# Patient Record
Sex: Male | Born: 1970 | Race: White | Hispanic: No | Marital: Married | Smoking: Former smoker
Health system: Southern US, Community
[De-identification: ages and names within clinical notes are randomized; demographics above are authoritative.]

## PROBLEM LIST (undated history)

## (undated) DIAGNOSIS — F419 Anxiety disorder, unspecified: Secondary | ICD-10-CM

## (undated) HISTORY — DX: Anxiety disorder, unspecified: F41.9

---

## 2001-07-30 ENCOUNTER — Emergency Department (HOSPITAL_COMMUNITY): Admission: EM | Admit: 2001-07-30 | Discharge: 2001-07-30 | Payer: Self-pay | Admitting: *Deleted

## 2001-07-30 ENCOUNTER — Encounter: Payer: Self-pay | Admitting: *Deleted

## 2005-04-16 ENCOUNTER — Emergency Department (HOSPITAL_COMMUNITY): Admission: EM | Admit: 2005-04-16 | Discharge: 2005-04-16 | Payer: Self-pay | Admitting: Emergency Medicine

## 2015-04-15 ENCOUNTER — Other Ambulatory Visit (HOSPITAL_COMMUNITY): Payer: Self-pay | Admitting: Physician Assistant

## 2015-12-10 ENCOUNTER — Ambulatory Visit: Payer: Self-pay | Admitting: Physician Assistant

## 2015-12-10 ENCOUNTER — Encounter: Payer: Self-pay | Admitting: Physician Assistant

## 2015-12-10 VITALS — BP 129/70 | HR 75 | Temp 98.3°F

## 2015-12-10 DIAGNOSIS — L259 Unspecified contact dermatitis, unspecified cause: Secondary | ICD-10-CM

## 2015-12-10 MED ORDER — DEXAMETHASONE SODIUM PHOSPHATE 10 MG/ML IJ SOLN
10.0000 mg | Freq: Once | INTRAMUSCULAR | Status: AC
  Administered 2015-12-10: 10 mg via INTRAMUSCULAR

## 2015-12-10 MED ORDER — PREDNISONE 10 MG (21) PO TBPK: 10.0000 mg | ORAL_TABLET | Freq: Every day | ORAL | Status: DC

## 2015-12-10 NOTE — Progress Notes (Signed)
S: c/o itchy rash on neck and legs, was outside in yard and then broke out, sx for few days, tried multiple otc meds without relief, denies fever/chills  O: vitals wnl, nad, lungs c t a, cv rrr, skin with small raised red areas some with streaks/blisters, no drainage, n/v intact  A: acute contact dermatitis  P: decadron 10mg  im, sterapred ds if needed

## 2015-12-17 ENCOUNTER — Ambulatory Visit: Payer: Self-pay | Admitting: Physician Assistant

## 2015-12-17 VITALS — BP 120/70 | HR 82 | Temp 98.0°F

## 2015-12-17 DIAGNOSIS — J209 Acute bronchitis, unspecified: Secondary | ICD-10-CM

## 2015-12-17 DIAGNOSIS — R21 Rash and other nonspecific skin eruption: Secondary | ICD-10-CM

## 2015-12-17 MED ORDER — BENZONATATE 200 MG PO CAPS: 200.0000 mg | ORAL_CAPSULE | Freq: Three times a day (TID) | ORAL | Status: DC | PRN

## 2015-12-17 MED ORDER — AZITHROMYCIN 250 MG PO TABS: ORAL_TABLET | ORAL | Status: DC

## 2015-12-17 MED ORDER — ALBUTEROL SULFATE HFA 108 (90 BASE) MCG/ACT IN AERS: 2.0000 | INHALATION_SPRAY | Freq: Four times a day (QID) | RESPIRATORY_TRACT | Status: DC | PRN

## 2015-12-17 NOTE — Progress Notes (Signed)
S: Continues to have rash.  States that rash never got any better with decadron injection and 60mg  taper pack.  Today itching "like crazy". Initally thought that this was poison ivy but area on neck looks different.   Also has sinus pain, cough, congestion with clear nasal drainage and green prod cough. No fever known.  Hx of smoking 15 years ago. O: Macular, red area left side of neck with scaling, right post. Knee same.  Diffuse red papules, no vesicles seen.  Throat min. Drainage, Lungs bilat with expir wheeze cleared with cough, course sound.  Heart RRR A: Dermatitis  Etiology unknown   Not responding to steriods     Acute bronchitis P: Referral to Dermatology     ProAir, tessalon and z-pak

## 2016-07-27 ENCOUNTER — Ambulatory Visit: Payer: Self-pay | Admitting: Physician Assistant

## 2016-07-27 VITALS — BP 110/70 | HR 74 | Temp 98.1°F

## 2016-07-27 DIAGNOSIS — J069 Acute upper respiratory infection, unspecified: Secondary | ICD-10-CM

## 2016-07-27 MED ORDER — CEFDINIR 300 MG PO CAPS: 300.0000 mg | ORAL_CAPSULE | Freq: Two times a day (BID) | ORAL | 0 refills | Status: DC

## 2016-07-27 MED ORDER — HYDROCOD POLST-CPM POLST ER 10-8 MG/5ML PO SUER: 5.0000 mL | Freq: Two times a day (BID) | ORAL | 0 refills | Status: DC | PRN

## 2016-07-27 MED ORDER — OSELTAMIVIR PHOSPHATE 75 MG PO CAPS: 75.0000 mg | ORAL_CAPSULE | Freq: Every day | ORAL | 0 refills | Status: DC

## 2016-07-27 NOTE — Progress Notes (Signed)
S: C/o cough and congestion for 2 days, + fever 2 days ago, night sweats last night, chills, denies cp/sob, v/d; mucus was green this am but clear throughout the day, cough is sporadic, his daughter had the flu last week  Using otc meds:   O: PE: vitals wnl, nad, skin is clammy, perrl eomi, normocephalic, tms dull, nasal mucosa red and swollen, throat injected, neck supple no lymph, lungs c t a, cv rrr, neuro intact, flu swab neg  A:  Acute uri   P: drink fluids, continue regular meds , use otc meds of choice, return if not improving in 5 days, return earlier if worsening  Omnicef, tamiflu preventive 75mg  qd, tussionex 150 ml nr

## 2016-11-22 ENCOUNTER — Ambulatory Visit: Payer: Self-pay | Admitting: Physician Assistant

## 2016-11-22 ENCOUNTER — Encounter: Payer: Self-pay | Admitting: Physician Assistant

## 2016-11-22 VITALS — BP 110/70 | HR 71 | Temp 98.5°F | Resp 16 | Ht 73.0 in | Wt 272.0 lb

## 2016-11-22 DIAGNOSIS — Z0000 Encounter for general adult medical examination without abnormal findings: Secondary | ICD-10-CM

## 2016-11-22 NOTE — Progress Notes (Signed)
S: pt here for wellness physical had biometrics for insurance purposes done at work, no complaints ros neg. 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

## 2017-06-01 DIAGNOSIS — L719 Rosacea, unspecified: Secondary | ICD-10-CM | POA: Insufficient documentation

## 2017-06-01 DIAGNOSIS — F419 Anxiety disorder, unspecified: Secondary | ICD-10-CM | POA: Insufficient documentation

## 2017-06-01 DIAGNOSIS — F410 Panic disorder [episodic paroxysmal anxiety] without agoraphobia: Secondary | ICD-10-CM | POA: Insufficient documentation

## 2017-08-04 DIAGNOSIS — E291 Testicular hypofunction: Secondary | ICD-10-CM | POA: Insufficient documentation

## 2017-11-03 ENCOUNTER — Ambulatory Visit: Payer: Self-pay | Admitting: Family Medicine

## 2017-11-03 VITALS — BP 140/80 | HR 68 | Resp 16 | Ht 73.0 in | Wt 270.0 lb

## 2017-11-03 DIAGNOSIS — Z0189 Encounter for other specified special examinations: Principal | ICD-10-CM

## 2017-11-03 DIAGNOSIS — Z008 Encounter for other general examination: Secondary | ICD-10-CM

## 2017-11-03 NOTE — Progress Notes (Signed)
Subjective: Annual biometrics screening  Patient presents for his annual biometric screening. Patient reports eating a healthy, well-rounded diet but denies getting regular exercise.  Patient regularly sees his primary care provider. PCP: in [place].  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 Patient denies any other issues or concerns.   Review of Systems Unremarkable  Objective  Physical Exam General: Awake, alert and oriented. No acute distress. Well developed, hydrated and nourished. Appears stated age.  HEENT: Supple neck without adenopathy. Sclera is non-icteric. The ear canal is clear without discharge. The tympanic membrane is normal in appearance with normal landmarks and cone of light. Nasal mucosa is pink and moist. Oral mucosa is pink and moist. The pharynx is normal in appearance without tonsillar swelling or exudates.  Skin: Skin in warm, dry and intact without rashes or lesions. Appropriate color for ethnicity. Cardiac: Heart rate and rhythm are normal. No murmurs, gallops, or rubs are auscultated.  Respiratory: The chest wall is symmetric and without deformity. No signs of respiratory distress. Lung sounds are clear in all lobes bilaterally without rales, ronchi, or wheezes.  Neurological: The patient is awake, alert and oriented to person, place, and time with normal speech.  Memory is normal and thought processes intact. No gait abnormalities are appreciated.  Psychiatric: Appropriate mood and affect.   Assessment Annual biometrics screening  Plan  Lipid panel and fasting blood sugar pending. Encouraged routine visits with primary care provider.  Patient's blood pressure is 140/80 today.  Patient reports this is due to anxiety related to coming to the clinic.  Discussed normal values.  Advised patient to monitor this regularly and report abnormal values to his primary care provider. Encouraged patient to get regular exercise and eat a healthy, well-rounded diet.

## 2017-11-04 LAB — LIPID PANEL
Chol/HDL Ratio: 6.1 ratio — ABNORMAL HIGH (ref 0.0–5.0)
Cholesterol, Total: 152 mg/dL (ref 100–199)
HDL: 25 mg/dL — ABNORMAL LOW (ref >39)
LDL Calculated: 111 mg/dL — ABNORMAL HIGH (ref 0–99)
Triglycerides: 82 mg/dL (ref 0–149)
VLDL Cholesterol Cal: 16 mg/dL (ref 5–40)

## 2017-11-04 LAB — GLUCOSE, RANDOM: Glucose: 89 mg/dL (ref 65–99)

## 2017-11-06 NOTE — Progress Notes (Signed)
Jorge HerterShannon, Will you call the patient and inform them that their lipid panel and fasting blood sugar came back?  Everything is normal, with the exception of his HDL cholesterol, LDL cholesterol, and cholesterol/HDL ratio. The HDL cholesterol ("good cholesterol") is decreased at 25, normal values are above 39.  The LDL cholesterol ("bad cholesterol") is elevated at 111, normal values are below 99.  The cholesterol/HDL ratio is elevated at 6.1, normal values are between 0 and 4.4 for women or 0 and 5 from men.  These abnormal values increase their risk for cardiovascular disease.  Please advise the patient to follow-up with their primary care provider regarding these results.

## 2018-02-09 ENCOUNTER — Ambulatory Visit
Admission: RE | Admit: 2018-02-09 | Discharge: 2018-02-09 | Disposition: A | Discharge status: Home / Self Care | Payer: Managed Care, Other (non HMO) | Source: Ambulatory Visit | Attending: Family Medicine | Admitting: Family Medicine

## 2018-02-09 ENCOUNTER — Ambulatory Visit: Payer: Self-pay | Admitting: Family Medicine

## 2018-02-09 VITALS — BP 138/86 | HR 68 | Temp 98.4°F | Resp 16

## 2018-02-09 DIAGNOSIS — R350 Frequency of micturition: Secondary | ICD-10-CM

## 2018-02-09 DIAGNOSIS — K5900 Constipation, unspecified: Secondary | ICD-10-CM | POA: Diagnosis not present

## 2018-02-09 DIAGNOSIS — R109 Unspecified abdominal pain: Secondary | ICD-10-CM | POA: Diagnosis not present

## 2018-02-09 DIAGNOSIS — R319 Hematuria, unspecified: Secondary | ICD-10-CM | POA: Insufficient documentation

## 2018-02-09 LAB — POCT URINALYSIS DIPSTICK
BILIRUBIN UA: NEGATIVE
GLUCOSE UA: NEGATIVE
KETONES UA: NEGATIVE
Leukocytes, UA: NEGATIVE
NITRITE UA: NEGATIVE
Protein, UA: NEGATIVE
SPEC GRAV UA: 1.025 (ref 1.010–1.025)
Urobilinogen, UA: 0.2 E.U./dL
pH, UA: 5 (ref 5.0–8.0)

## 2018-02-09 NOTE — Progress Notes (Signed)
Subjective: back pain     Jorge Medina is a 47 y.o. male who presents for evaluation of left low back pain that began insidiously 2 days ago. The patient has had no prior back problems. Symptoms have been present for 2 days and have gradually worsened.  Onset was related to / precipitated by no known injury. The pain is located in the left lumbar area/flank and radiates to the left groin/scrotum. The pain is described as sharp and occurs intermittently.  Rates his pain at 8/10 and reports that he does not usually go to the doctor but that pain has been severe.  Symptoms are exacerbated by standing up straight . Factors which relieve the pain include change in body position with some relief. Symptoms are improved by sitting or laying down and briefly after urination. He has also tried NSAIDs which provided no symptom relief. He has constipation associated with the back pain.  Patient unclear if this is a normal variation for him.  Small BM yesterday but has not had one today, doesn't think he had one for a few days before his small BM yesterday.  Previous history of symptoms: never.  Patient also reports urinary frequency.  Denies any other urinary symptoms.  Denies any history of nephrolithiasis.  Denies fever, chills, malaise, fatigue, nausea, vomiting, myalgias, or arthralgias.  Denies testicular erythema or edema.  Denies any other relevant history.  Denies urinary retention or saddle anesthesia.  Denies numbness, tingling, weakness, spasticity, or any neurologic symptoms.  Denies any other symptoms or complaints.   Review of Systems Pertinent items noted in HPI and remainder of comprehensive ROS otherwise negative.    Objective:  General appearance: alert, cooperative, appears stated age and moderate distress with position change only. No distress otherwise.  Abdomen:  Normal findings: bowel sounds normal, no masses palpable, no organomegaly, soft, and symmetric.  No guarding or rebound tenderness.   Negative Rovsing sign. Abnormal findings:  moderate tenderness in the lower abdomen bilaterally.  Extremities: extremities normal, atraumatic, no cyanosis or edema Pulses: 2+ and symmetric Skin: Skin color, texture, turgor normal. No rashes or lesions  Back: Full range of motion, mild generalized tenderness to left lumbar and thoracic regions.  No midline tenderness or deformity.  No spasm, normal curvature.  Symmetric.  Normal gait. Patient endorses immediate pain in his left lower back upon lifting both his left and subsequently his right leg off of the table during straight leg raise testing.  Patient appears to be in significant pain when changing positions and it takes him a long time to do so.  Neurological: normal DTRs, muscle strength and reflexes.   Diagnostic Results: Urine dipstick negative for all components except 1+ blood.   EXAM: ABDOMEN - 1 VIEW  COMPARISON: None.  FINDINGS: Bowel gas pattern is nonobstructive. Moderate amount of stool within the colon. No evidence of soft tissue mass or abnormal fluid collection. No evidence of free intraperitoneal air. No evidence of renal or ureteral calculi. Osseous structures are unremarkable. Lung bases appear clear.  IMPRESSION: Negative.   Electronically Signed By: Bary Richard M.D. On: 02/09/2018 14:30  Assessment:   Left low back pain Urinary frequency Constipation Hematuria   Plan:   Patient declined Toradol shot.  Discussed x-ray findings with patient. Referred patient to the emergency department for further evaluation and treatment.  We are unable to obtain a CT or MRI through our clinic.  Consulted Dr. Sullivan Lone (collaborating physician) regarding the care and treatment plan of this patient.

## 2018-03-13 ENCOUNTER — Encounter: Payer: Self-pay | Admitting: Emergency Medicine

## 2018-03-13 ENCOUNTER — Ambulatory Visit: Payer: Self-pay | Admitting: Emergency Medicine

## 2018-03-13 VITALS — BP 140/78 | HR 83 | Temp 98.3°F | Resp 14 | Ht 73.0 in | Wt 283.0 lb

## 2018-03-13 DIAGNOSIS — J0101 Acute recurrent maxillary sinusitis: Secondary | ICD-10-CM

## 2018-03-13 DIAGNOSIS — J209 Acute bronchitis, unspecified: Secondary | ICD-10-CM

## 2018-03-13 MED ORDER — PREDNISONE 50 MG PO TABS: ORAL_TABLET | ORAL | 0 refills | Status: DC

## 2018-03-13 MED ORDER — CEFDINIR 300 MG PO CAPS: 300.0000 mg | ORAL_CAPSULE | Freq: Two times a day (BID) | ORAL | 0 refills | Status: DC

## 2018-03-13 NOTE — Progress Notes (Signed)
Subjective:     Patient ID: Jorge Medina, male   DOB: 1970-09-20, 47 y.o.   MRN: [id number]  HPI URI HISTORY  [name] is a 47 y.o. male who complains of onset of cough and cold symptoms and sinus symptoms for 5 days, progressively worsening.   have been using over-the-counter treatment which helps a little bit.  In the past, he has had sinus infections and bronchitis, and has an albuterol inhaler at home if needed for wheezing but he denies prior diagnosis of asthma. Former smoker.  No chills/sweats + Low-grade fever  +  Nasal congestion +  Discolored green post-nasal drainage, blowing out green nasal mucus Mild sinus pain/pressure No sore throat  +  Cough, productive of greenish sputum Mild wheezing Positive, chest congestion No hemoptysis No shortness of breath No pleuritic pain  No itchy/red eyes No earache  No nausea No vomiting No abdominal pain No diarrhea  No skin rashes +  Fatigue No myalgias No headache    Review of Systems  All other systems reviewed and are negative.      Objective:   Physical Exam  Constitutional: He is oriented to person, place, and time. He appears well-developed and well-nourished. No distress.  HENT:  Head: Normocephalic and atraumatic.  Right Ear: Tympanic membrane, external ear and ear canal normal.  Left Ear: Tympanic membrane, external ear and ear canal normal.  Nose: Mucosal edema and rhinorrhea present. Right sinus exhibits maxillary sinus tenderness. Left sinus exhibits maxillary sinus tenderness.  Mouth/Throat: Oropharynx is clear and moist. No oral lesions. No oropharyngeal exudate.  Eyes: Right eye exhibits no discharge. Left eye exhibits no discharge. No scleral icterus.  Neck: Neck supple.  Cardiovascular: Normal rate, regular rhythm and normal heart sounds.  Pulmonary/Chest: Effort normal. He has wheezes (Mild, late expiratory bilaterally.  Good air movement bilaterally). He has rhonchi. He has no rales.   Lymphadenopathy:    He has no cervical adenopathy.  Neurological: He is alert and oriented to person, place, and time.  Skin: Skin is warm and dry.  Nursing note and vitals reviewed.      Assessment:     Acute maxillary sinusitis, with bronchitis with mild bronchospasm.  In my opinion, he likely improve with antibiotic as there is likely a bacterial component.  Also, inflammatory component    Plan:      Treatment options discussed, as well as risks, benefits, alternatives. Patient voiced understanding and agreement with the following plans:  An After Visit Summary was printed and given to the patient. You have a sinus infection with mild bronchitis with mild wheezing. 2 prescriptions have been sent to your pharmacy: Cefdinir twice a day for 10 days-this is an antibiotic. Prednisone, once a day with food 5 days.  This should really help with wheezing and cough inflammation. Okay to take OTC decongestant or antihistamine or cough med if needed. If needed, you can use the albuterol inhaler that you have at home, 2 puffs every 6 hours if needed for wheezing. Follow-up with your PCP if not improved in 5 to 7 days, sooner if worse.(Emergency room if any severe symptoms) New Prescriptions   CEFDINIR (OMNICEF) 300 MG CAPSULE    Take 1 capsule (300 mg total) by mouth 2 (two) times daily. X 10 days   PREDNISONE (DELTASONE) 50 MG TABLET    Take 1 daily with a meal for 5 days   Precautions discussed. Red flags discussed. Questions invited and answered. Patient voiced understanding and agreement.

## 2018-03-13 NOTE — Patient Instructions (Addendum)
You have a sinus infection with mild bronchitis with mild wheezing. 2 prescriptions have been sent to your pharmacy: Cefdinir twice a day for 10 days-this is an antibiotic. Prednisone, once a day with food 5 days.  This should really help with wheezing and cough inflammation. Okay to take OTC decongestant or antihistamine or cough med if needed. If needed, you can use the albuterol inhaler that you have at home, 2 puffs every 6 hours if needed for wheezing. Follow-up with your PCP if not improved in 5 to 7 days, sooner if worse.(Emergency room if any severe symptoms) Acute Bronchitis, Adult Acute bronchitis is when air tubes (bronchi) in the lungs suddenly get swollen. The condition can make it hard to breathe. It can also cause these symptoms:  A cough.  Coughing up clear, yellow, or green mucus.  Wheezing.  Chest congestion.  Shortness of breath.  A fever.  Body aches.  Chills.  A sore throat.  Follow these instructions at home: Medicines  Take over-the-counter and prescription medicines only as told by your doctor.  If you were prescribed an antibiotic medicine, take it as told by your doctor. Do not stop taking the antibiotic even if you start to feel better. General instructions  Rest.  Drink enough fluids to keep your pee (urine) clear or pale yellow.  Avoid smoking and secondhand smoke. If you smoke and you need help quitting, ask your doctor. Quitting will help your lungs heal faster.  Use an inhaler, cool mist vaporizer, or humidifier as told by your doctor.  Keep all follow-up visits as told by your doctor. This is important. How is this prevented? To lower your risk of getting this condition again:  Wash your hands often with soap and water. If you cannot use soap and water, use hand sanitizer.  Avoid contact with people who have cold symptoms.  Try not to touch your hands to your mouth, nose, or eyes.  Make sure to get the flu shot every  year.  Contact a doctor if:  Your symptoms do not get better in 2 weeks. Get help right away if:  You cough up blood.  You have chest pain.  You have very bad shortness of breath.  You become dehydrated.  You faint (pass out) or keep feeling like you are going to pass out.  You keep throwing up (vomiting).  You have a very bad headache.  Your fever or chills gets worse. This information is not intended to replace advice given to you by your health care provider. Make sure you discuss any questions you have with your health care provider. Document Released: 11/09/2007 Document Revised: 12/30/2015 Document Reviewed: 11/11/2015 Elsevier Interactive Patient Education  2018 [employer].  Sinusitis, Adult Sinusitis is soreness and inflammation of your sinuses. Sinuses are hollow spaces in the bones around your face. They are located:  Around your eyes.  In the middle of your forehead.  Behind your nose.  In your cheekbones.  Your sinuses and nasal passages are lined with a stringy fluid (mucus). Mucus normally drains out of your sinuses. When your nasal tissues get inflamed or swollen, the mucus can get trapped or blocked so air cannot flow through your sinuses. This lets bacteria, viruses, and funguses grow, and that leads to infection. Follow these instructions at home: Medicines  Take, use, or apply over-the-counter and prescription medicines only as told by your doctor. These may include nasal sprays.  If you were prescribed an antibiotic medicine, take it as  told by your doctor. Do not stop taking the antibiotic even if you start to feel better. Hydrate and Humidify  Drink enough water to keep your pee (urine) clear or pale yellow.  Use a cool mist humidifier to keep the humidity level in your home above 50%.  Breathe in steam for 10-15 minutes, 3-4 times a day or as told by your doctor. You can do this in the bathroom while a hot shower is running.  Try not to  spend time in cool or dry air. Rest  Rest as much as possible.  Sleep with your head raised (elevated).  Make sure to get enough sleep each night. General instructions  Put a warm, moist washcloth on your face 3-4 times a day or as told by your doctor. This will help with discomfort.  Wash your hands often with soap and water. If there is no soap and water, use hand sanitizer.  Do not smoke. Avoid being around people who are smoking (secondhand smoke).  Keep all follow-up visits as told by your doctor. This is important. Contact a doctor if:  You have a fever.  Your symptoms get worse.  Your symptoms do not get better within 10 days. Get help right away if:  You have a very bad headache.  You cannot stop throwing up (vomiting).  You have pain or swelling around your face or eyes.  You have trouble seeing.  You feel confused.  Your neck is stiff.  You have trouble breathing.

## 2018-11-15 ENCOUNTER — Other Ambulatory Visit: Payer: Self-pay

## 2018-11-15 ENCOUNTER — Encounter: Payer: Self-pay | Admitting: Adult Health

## 2018-11-15 ENCOUNTER — Telehealth: Payer: Managed Care, Other (non HMO) | Admitting: Adult Health

## 2018-11-15 DIAGNOSIS — R079 Chest pain, unspecified: Secondary | ICD-10-CM

## 2018-11-15 DIAGNOSIS — Z789 Other specified health status: Secondary | ICD-10-CM

## 2018-11-15 DIAGNOSIS — R110 Nausea: Secondary | ICD-10-CM

## 2018-11-15 NOTE — Addendum Note (Signed)
Addended by: Doreen Beam on: 11/15/2018 01:01 PM   Modules accepted: Orders

## 2018-11-15 NOTE — Patient Instructions (Addendum)
Abdominal Pain, Adult  Many things can cause belly (abdominal) pain. Most times, belly pain is not dangerous. Many cases of belly pain can be watched and treated at home. Sometimes belly pain is serious, though. Your doctor will try to find the cause of your belly pain. Follow these instructions at home:  Take over-the-counter and prescription medicines only as told by your doctor. Do not take medicines that help you poop (laxatives) unless told to by your doctor.  Drink enough fluid to keep your pee (urine) clear or pale yellow.  Watch your belly pain for any changes.  Keep all follow-up visits as told by your doctor. This is important. Contact a doctor if:  Your belly pain changes or gets worse.  You are not hungry, or you lose weight without trying.  You are having trouble pooping (constipated) or have watery poop (diarrhea) for more than 2-3 days.  You have pain when you pee or poop.  Your belly pain wakes you up at night.  Your pain gets worse with meals, after eating, or with certain foods.  You are throwing up and cannot keep anything down.  You have a fever. Get help right away if:  Your pain does not go away as soon as your doctor says it should.  You cannot stop throwing up.  Your pain is only in areas of your belly, such as the right side or the left lower part of the belly.  You have bloody or black poop, or poop that looks like tar.  You have very bad pain, cramping, or bloating in your belly.  You have signs of not having enough fluid or water in your body (dehydration), such as: ? Dark pee, very little pee, or no pee. ? Cracked lips. ? Dry mouth. ? Sunken eyes. ? Sleepiness. ? Weakness. This information is not intended to replace advice given to you by your health care provider. Make sure you discuss any questions you have with your health care provider. Document Released: 11/09/2007 Document Revised: 12/11/2015 Document Reviewed: 11/04/2015 Elsevier  Interactive Patient Education  2019 [employer] Nonspecific Chest Pain Chest pain can be caused by many different conditions. Some causes of chest pain can be life-threatening. These will require treatment right away. Serious causes of chest pain include:  Heart attack.  A tear in the body's main blood vessel.  Redness and swelling (inflammation) around your heart.  Blood clot in your lungs. Other causes of chest pain may not be so serious. These include:  Heartburn.  Anxiety or stress.  Damage to bones or muscles in your chest.  Lung infections. Chest pain can feel like:  Pain or discomfort in your chest.  Crushing, pressure, aching, or squeezing pain.  Burning or tingling.  Dull or sharp pain that is worse when you move, cough, or take a deep breath.  Pain or discomfort that is also felt in your back, neck, jaw, shoulder, or arm, or pain that spreads to any of these areas. It is hard to know whether your pain is caused by something that is serious or something that is not so serious. So it is important to see your doctor right away if you have chest pain. Follow these instructions at home: Medicines  Take over-the-counter and prescription medicines only as told by your doctor.  If you were prescribed an antibiotic medicine, take it as told by your doctor. Do not stop taking the antibiotic even if you start to feel better. Lifestyle   Rest  as told by your doctor.  Do not use any products that contain nicotine or tobacco, such as cigarettes, e-cigarettes, and chewing tobacco. If you need help quitting, ask your doctor.  Do not drink alcohol.  Make lifestyle changes as told by your doctor. These may include: ? Getting regular exercise. Ask your doctor what activities are safe for you. ? Eating a heart-healthy diet. A diet and nutrition specialist (dietitian) can help you to learn healthy eating options. ? Staying at a healthy weight. ? Treating diabetes or high  blood pressure, if needed. ? Lowering your stress. Activities such as yoga and relaxation techniques can help. General instructions  Pay attention to any changes in your symptoms. Tell your doctor about them or any new symptoms.  Avoid any activities that cause chest pain.  Keep all follow-up visits as told by your doctor. This is important. You may need more testing if your chest pain does not go away. Contact a doctor if:  Your chest pain does not go away.  You feel depressed.  You have a fever. Get help right away if:  Your chest pain is worse.  You have a cough that gets worse, or you cough up blood.  You have very bad (severe) pain in your belly (abdomen).  You pass out (faint).  You have either of these for no clear reason: ? Sudden chest discomfort. ? Sudden discomfort in your arms, back, neck, or jaw.  You have shortness of breath at any time.  You suddenly start to sweat, or your skin gets clammy.  You feel sick to your stomach (nauseous).  You throw up (vomit).  You suddenly feel lightheaded or dizzy.  You feel very weak or tired.  Your heart starts to beat fast, or it feels like it is skipping beats. These symptoms may be an emergency. Do not wait to see if the symptoms will go away. Get medical help right away. Call your local emergency services (911 in the [place] Do not drive yourself to the hospital. Summary  Chest pain can be caused by many different conditions. The cause may be serious and need treatment right away. If you have chest pain, see your doctor right away.  Follow your doctor's instructions for taking medicines and making lifestyle changes.  Keep all follow-up visits as told by your doctor. This includes visits for any further testing if your chest pain does not go away.  Be sure to know the signs that show that your condition has become worse. Get help right away if you have these symptoms. This information is not intended to replace  advice given to you by your health care provider. Make sure you discuss any questions you have with your health care provider. Document Released: 11/09/2007 Document Revised: 11/23/2017 Document Reviewed: 11/23/2017 Elsevier Interactive Patient Education  2019 [employer] Inc. Nausea, Adult Nausea is feeling sick to your stomach or feeling that you are about to throw up (vomit). Feeling sick to your stomach is usually not serious, but it may be an early sign of a more serious medical problem. As you feel sicker to your stomach, you may throw up. If you throw up, or if you are not able to drink enough fluids, there is a risk that you may lose too much water in your body (get dehydrated). If you lose too much water in your body, you may:  Feel tired.  Feel thirsty.  Have a dry mouth.  Have cracked lips.  Go pee (urinate) less  often. Older adults and people who have other diseases or a weak body defense system (immune system) have a higher risk of losing too much water in the body. The main goals of treating this condition are:  To relieve your nausea.  To ensure your nausea occurs less often.  To prevent throwing up and losing too much fluid. Follow these instructions at home: Watch your symptoms for any changes. Tell your doctor about them. Follow these instructions as told by your doctor. Eating and drinking      Take an ORS (oral rehydration solution). This is a drink that is sold at pharmacies and stores.  Drink clear fluids in small amounts as you are able. These include: ? Water. ? Ice chips. ? Fruit juice that has water added (diluted fruit juice). ? Low-calorie sports drinks.  Eat bland, easy-to-digest foods in small amounts as you are able, such as: ? Bananas. ? Applesauce. ? Rice. ? Low-fat (lean) meats. ? Toast. ? Crackers.  Avoid drinking fluids that have a lot of sugar or caffeine in them. This includes energy drinks, sports drinks, and soda.  Avoid alcohol.   Avoid spicy or fatty foods. General instructions  Take over-the-counter and prescription medicines only as told by your doctor.  Rest at home while you get better.  Drink enough fluid to keep your pee (urine) pale yellow.  Take slow and deep breaths when you feel sick to your stomach.  Avoid food or things that have strong smells.  Wash your hands often with soap and water. If you cannot use soap and water, use hand sanitizer.  Make sure that all people in your home wash their hands well and often.  Keep all follow-up visits as told by your doctor. This is important. Contact a doctor if:  You feel sicker to your stomach.  You feel sick to your stomach for more than 2 days.  You throw up.  You are not able to drink fluids without throwing up.  You have new symptoms.  You have a fever.  You have a headache.  You have muscle cramps.  You have a rash.  You have pain while peeing.  You feel light-headed or dizzy. Get help right away if:  You have pain in your chest, neck, arm, or jaw.  You feel very weak or you pass out (faint).  You have throw up that is bright red or looks like coffee grounds.  You have bloody or black poop (stools) or poop that looks like tar.  You have a very bad headache, a stiff neck, or both.  You have very bad pain, cramping, or bloating in your belly (abdomen).  You have trouble breathing or you are breathing very quickly.  Your heart is beating very quickly.  Your skin feels cold and clammy.  You feel confused.  You have signs of losing too much water in your body, such as: ? Dark pee, very little pee, or no pee. ? Cracked lips. ? Dry mouth. ? Sunken eyes. ? Sleepiness. ? Weakness. These symptoms may be an emergency. Do not wait to see if the symptoms will go away. Get medical help right away. Call your local emergency services (911 in the [place] Do not drive yourself to the hospital. Summary  Nausea is feeling sick to  your stomach or feeling that you are about to throw up (vomit).  If you throw up, or if you are not able to drink enough fluids, there is a risk that  you may lose too much water in your body (get dehydrated).  Eat and drink what your doctor tells you. Take over-the-counter and prescription medicines only as told by your doctor.  Contact a doctor right away if your symptoms get worse or you have new symptoms.  Keep all follow-up visits as told by your doctor. This is important. This information is not intended to replace advice given to you by your health care provider. Make sure you discuss any questions you have with your health care provider. Document Released: 05/12/2011 Document Revised: 10/31/2017 Document Reviewed: 10/31/2017 Elsevier Interactive Patient Education  2019 [employer]

## 2018-11-15 NOTE — Progress Notes (Addendum)
Virtual Visit via Video Note  I connected with Jorge Medina on 11/15/18 at 12:30 PM EDT by a video enabled telemedicine application and verified that I am speaking with the correct person using two identifiers.  Location: Patient: at his home  Provider: in the office at [facility] building    I discussed the limitations of evaluation and management by telemedicine and the availability of in person appointments. The patient expressed understanding and agreed to proceed.  History of Present Illness: Patient is a 48 year old male in no acute distress who calls the clinic for complaint of nausea onset since 11/12/18, decreased energy, fatigue, and states he has " left sided chest pain mostly when he lies down" , he is  also having intermittent  left lower abdominal pain. Chest pain is intermittent, described as dull ache.6/10. He reports he has had mild shortness of breath at times with moving.   Decreased appetite due to pain and nausea.   He denies any known covid exposures.  Last Bowel movement was Wednesday 11/14/18 denies blood, mucous, or tarry dark color.  Urinating normal.   He has mild post nasal drip since mowing the yard over the weekend. He denies any cough.   Patient  denies any fever, body aches,chills, rash,  , nausea, vomiting, or diarrhea. Denies back pain.  Denies any ill exposures.  Denies diaphoresis or radiating pain to neck, jaw or shoulder.  Denies any previous abdominal surgeries or cardiac history.   He does have history listed of panic disorder and anxiety.  Allergies  Allergen Reactions  . Nalbuphine Other (See Comments)      Observations/Objective:   Patient is alert and oriented and responsive to questions Engages in conversation with provider. Speaks in full sentences without any pauses without any shortness of breath or distress.   He appears to have pale skin on video.     Assessment and Plan:  [name] was seen today for nasal congestion, nausea and  cough.  Diagnoses and all orders for this visit:  Chest pain, unspecified type  Nausea without vomiting  Afebrile   Go to the emergency room now do not self drive, for Call [phone] for emergencies.  Follow Up Instructions: Patient advised emergency room now for evaluation to rule out cardiac versus abdominal etiology. Needs stat labs troponin, CKMB and EKG, possible abdominal imaging/chest xray.  Patient verbalized understanding of all instructions given and denies any further questions at this time.   He is advised not to self drive.  May follow up with primary care or this office after emergency evaluation. Wear mask to the emergency room.    I discussed the assessment and treatment plan with the patient. The patient was provided an opportunity to ask questions and all were answered. The patient agreed with the plan and demonstrated an understanding of the instructions.    The patient was advised to call back or seek an in-person evaluation if the symptoms worsen or if the condition fails to improve as anticipated.  I provided 15 minutes of non-face-to-face time during this encounter.   Marcille Buffy, FNP

## 2019-01-24 ENCOUNTER — Ambulatory Visit: Payer: Managed Care, Other (non HMO) | Admitting: Adult Health

## 2019-01-24 ENCOUNTER — Other Ambulatory Visit: Payer: Self-pay

## 2019-01-24 ENCOUNTER — Encounter: Payer: Self-pay | Admitting: Adult Health

## 2019-01-24 VITALS — BP 138/90 | HR 83 | Temp 98.1°F | Resp 18 | Ht 73.0 in | Wt 292.0 lb

## 2019-01-24 DIAGNOSIS — Z008 Encounter for other general examination: Secondary | ICD-10-CM | POA: Diagnosis not present

## 2019-01-24 DIAGNOSIS — Z0189 Encounter for other specified special examinations: Secondary | ICD-10-CM | POA: Diagnosis not present

## 2019-01-24 NOTE — Patient Instructions (Addendum)
I will have the office call you on your glucose and cholesterol results when they return if you have not heard within 1 week please call the office.  This biometric physical is a brief physical and the only labs done are glucose and your lipid panel(cholesterol) and is  not a substitute for seeing a primary care provider for a complete annual physical. Please see a primary care physician for routine health maintenance, labs and full physical at least yearly and follow up as recommended by your provider. Provider also recommends if you do not have a primary care provider for patient to establish care as soon as possible .Patient may chose provider of choice. Also gave the [place]  PHYSICIAN/PROVIDER  REFERRAL LINE at [phone] or web site at [email or web address] to help assist with finding a primary care doctor.  Patient verbalizes understanding that his office is acute care only and not a substitute for a primary care or for the management of chronic conditions.    DASH Eating Plan DASH stands for "Dietary Approaches to Stop Hypertension." The DASH eating plan is a healthy eating plan that has been shown to reduce high blood pressure (hypertension). It may also reduce your risk for type 2 diabetes, heart disease, and stroke. The DASH eating plan may also help with weight loss. What are tips for following this plan?  General guidelines  Avoid eating more than 2,300 mg (milligrams) of salt (sodium) a day. If you have hypertension, you may need to reduce your sodium intake to 1,500 mg a day.  Limit alcohol intake to no more than 1 drink a day for nonpregnant women and 2 drinks a day for men. One drink equals 12 oz of beer, 5 oz of wine, or 1 oz of hard liquor.  Work with your health care provider to maintain a healthy body weight or to lose weight. Ask what an ideal weight is for you.  Get at least 30 minutes of exercise that causes your heart to beat faster (aerobic exercise) most days of  the week. Activities may include walking, swimming, or biking.  Work with your health care provider or diet and nutrition specialist (dietitian) to adjust your eating plan to your individual calorie needs. Reading food labels   Check food labels for the amount of sodium per serving. Choose foods with less than 5 percent of the Daily Value of sodium. Generally, foods with less than 300 mg of sodium per serving fit into this eating plan.  To find whole grains, look for the word "whole" as the first word in the ingredient list. Shopping  Buy products labeled as "low-sodium" or "no salt added."  Buy fresh foods. Avoid canned foods and premade or frozen meals. Cooking  Avoid adding salt when cooking. Use salt-free seasonings or herbs instead of table salt or sea salt. Check with your health care provider or pharmacist before using salt substitutes.  Do not fry foods. Cook foods using healthy methods such as baking, boiling, grilling, and broiling instead.  Cook with heart-healthy oils, such as olive, canola, soybean, or sunflower oil. Meal planning  Eat a balanced diet that includes: ? 5 or more servings of fruits and vegetables each day. At each meal, try to fill half of your plate with fruits and vegetables. ? Up to 6-8 servings of whole grains each day. ? Less than 6 oz of lean meat, poultry, or fish each day. A 3-oz serving of meat is about the same size  as a deck of cards. One egg equals 1 oz. ? 2 servings of low-fat dairy each day. ? A serving of nuts, seeds, or beans 5 times each week. ? Heart-healthy fats. Healthy fats called Omega-3 fatty acids are found in foods such as flaxseeds and coldwater fish, like sardines, salmon, and mackerel.  Limit how much you eat of the following: ? Canned or prepackaged foods. ? Food that is high in trans fat, such as fried foods. ? Food that is high in saturated fat, such as fatty meat. ? Sweets, desserts, sugary drinks, and other foods with  added sugar. ? Full-fat dairy products.  Do not salt foods before eating.  Try to eat at least 2 vegetarian meals each week.  Eat more home-cooked food and less restaurant, buffet, and fast food.  When eating at a restaurant, ask that your food be prepared with less salt or no salt, if possible. What foods are recommended? The items listed may not be a complete list. Talk with your dietitian about what dietary choices are best for you. Grains Whole-grain or whole-wheat bread. Whole-grain or whole-wheat pasta. Brown rice. [place]. Bulgur. Whole-grain and low-sodium cereals. Pita bread. Low-fat, low-sodium crackers. Whole-wheat flour tortillas. Vegetables Fresh or frozen vegetables (raw, steamed, roasted, or grilled). Low-sodium or reduced-sodium tomato and vegetable juice. Low-sodium or reduced-sodium tomato sauce and tomato paste. Low-sodium or reduced-sodium canned vegetables. Fruits All fresh, dried, or frozen fruit. Canned fruit in natural juice (without added sugar). Meat and other protein foods Skinless chicken or [place]. Ground chicken or [place]. Pork with fat trimmed off. Fish and seafood. Egg whites. Dried beans, peas, or lentils. Unsalted nuts, nut butters, and seeds. Unsalted canned beans. Lean cuts of beef with fat trimmed off. Low-sodium, lean deli meat. Dairy Low-fat (1%) or fat-free (skim) milk. Fat-free, low-fat, or reduced-fat cheeses. Nonfat, low-sodium ricotta or cottage cheese. Low-fat or nonfat yogurt. Low-fat, low-sodium cheese. Fats and oils Soft margarine without trans fats. Vegetable oil. Low-fat, reduced-fat, or light mayonnaise and salad dressings (reduced-sodium). Canola, safflower, olive, soybean, and sunflower oils. Avocado. Seasoning and other foods Herbs. Spices. Seasoning mixes without salt. Unsalted popcorn and pretzels. Fat-free sweets. What foods are not recommended? The items listed may not be a complete list. Talk with your dietitian about what  dietary choices are best for you. Grains Baked goods made with fat, such as croissants, muffins, or some breads. Dry pasta or rice meal packs. Vegetables Creamed or fried vegetables. Vegetables in a cheese sauce. Regular canned vegetables (not low-sodium or reduced-sodium). Regular canned tomato sauce and paste (not low-sodium or reduced-sodium). Regular tomato and vegetable juice (not low-sodium or reduced-sodium). Angie Fava. Olives. Fruits Canned fruit in a light or heavy syrup. Fried fruit. Fruit in cream or butter sauce. Meat and other protein foods Fatty cuts of meat. Ribs. Fried meat. Berniece Salines. Sausage. Bologna and other processed lunch meats. Salami. Fatback. Hotdogs. Bratwurst. Salted nuts and seeds. Canned beans with added salt. Canned or smoked fish. Whole eggs or egg yolks. Chicken or [place] with skin. Dairy Whole or 2% milk, cream, and half-and-half. Whole or full-fat cream cheese. Whole-fat or sweetened yogurt. Full-fat cheese. Nondairy creamers. Whipped toppings. Processed cheese and cheese spreads. Fats and oils Butter. Stick margarine. Lard. Shortening. Ghee. Bacon fat. Tropical oils, such as coconut, palm kernel, or palm oil. Seasoning and other foods Salted popcorn and pretzels. Onion salt, garlic salt, seasoned salt, table salt, and sea salt. Worcestershire sauce. Tartar sauce. Barbecue sauce. Teriyaki sauce. Soy sauce, including reduced-sodium. Steak  sauce. Canned and packaged gravies. Fish sauce. Oyster sauce. Cocktail sauce. Horseradish that you find on the shelf. Ketchup. Mustard. Meat flavorings and tenderizers. Bouillon cubes. Hot sauce and Tabasco sauce. Premade or packaged marinades. Premade or packaged taco seasonings. Relishes. Regular salad dressings. Where to find more information:  National Heart, Lung, and [place]: https://wilson-eaton.com/  American Heart Association: www.heart.org Summary  The DASH eating plan is a healthy eating plan that has been shown to reduce  high blood pressure (hypertension). It may also reduce your risk for type 2 diabetes, heart disease, and stroke.  With the DASH eating plan, you should limit salt (sodium) intake to 2,300 mg a day. If you have hypertension, you may need to reduce your sodium intake to 1,500 mg a day.  When on the DASH eating plan, aim to eat more fresh fruits and vegetables, whole grains, lean proteins, low-fat dairy, and heart-healthy fats.  Work with your health care provider or diet and nutrition specialist (dietitian) to adjust your eating plan to your individual calorie needs. This information is not intended to replace advice given to you by your health care provider. Make sure you discuss any questions you have with your health care provider. Document Released: 05/12/2011 Document Revised: 05/05/2017 Document Reviewed: 05/16/2016 Elsevier Patient Education  [place]. Preventing Hypertension Hypertension, commonly called high blood pressure, is when the force of blood pumping through the arteries is too strong. Arteries are blood vessels that carry blood from the heart throughout the body. Over time, hypertension can damage the arteries and decrease blood flow to important parts of the body, including the brain, heart, and kidneys. Often, hypertension does not cause symptoms until blood pressure is very high. For this reason, it is important to have your blood pressure checked on a regular basis. Hypertension can often be prevented with diet and lifestyle changes. If you already have hypertension, you can control it with diet and lifestyle changes, as well as medicine. What nutrition changes can be made? Maintain a healthy diet. This includes:  Eating less salt (sodium). Ask your health care provider how much sodium is safe for you to have. The general recommendation is to consume less than 1 tsp (2,300 mg) of sodium a day. ? Do not add salt to your food. ? Choose low-sodium options when grocery  shopping and eating out.  Limiting fats in your diet. You can do this by eating low-fat or fat-free dairy products and by eating less red meat.  Eating more fruits, vegetables, and whole grains. Make a goal to eat: ? 1-2 cups of fresh fruits and vegetables each day. ? 3-4 servings of whole grains each day.  Avoiding foods and beverages that have added sugars.  Eating fish that contain healthy fats (omega-3 fatty acids), such as mackerel or salmon. If you need help putting together a healthy eating plan, try the DASH diet. This diet is high in fruits, vegetables, and whole grains. It is low in sodium, red meat, and added sugars. DASH stands for Dietary Approaches to Stop Hypertension. What lifestyle changes can be made?   Lose weight if you are overweight. Losing just 3?5% of your body weight can help prevent or control hypertension. ? For example, if your present weight is 200 lb (91 kg), a loss of 3-5% of your weight means losing 6-10 lb (2.7-4.5 kg). ? Ask your health care provider to help you with a diet and exercise plan to safely lose weight.  Get enough exercise. Do at  least 150 minutes of moderate-intensity exercise each week. ? You could do this in short exercise sessions several times a day, or you could do longer exercise sessions a few times a week. For example, you could take a brisk 10-minute walk or bike ride, 3 times a day, for 5 days a week.  Find ways to reduce stress, such as exercising, meditating, listening to music, or taking a yoga class. If you need help reducing stress, ask your health care provider.  Do not smoke. This includes e-cigarettes. Chemicals in tobacco and nicotine products raise your blood pressure each time you smoke. If you need help quitting, ask your health care provider.  Avoid alcohol. If you drink alcohol, limit alcohol intake to no more than 1 drink a day for nonpregnant women and 2 drinks a day for men. One drink equals 12 oz of beer, 5 oz of  wine, or 1 oz of hard liquor. Why are these changes important? Diet and lifestyle changes can help you prevent hypertension, and they may make you feel better overall and improve your quality of life. If you have hypertension, making these changes will help you control it and help prevent major complications, such as:  Hardening and narrowing of arteries that supply blood to: ? Your heart. This can cause a heart attack. ? Your brain. This can cause a stroke. ? Your kidneys. This can cause kidney failure.  Stress on your heart muscle, which can cause heart failure. What can I do to lower my risk?  Work with your health care provider to make a hypertension prevention plan that works for you. Follow your plan and keep all follow-up visits as told by your health care provider.  Learn how to check your blood pressure at home. Make sure that you know your personal target blood pressure, as told by your health care provider. How is this treated? In addition to diet and lifestyle changes, your health care provider may recommend medicines to help lower your blood pressure. You may need to try a few different medicines to find what works best for you. You also may need to take more than one medicine. Take over-the-counter and prescription medicines only as told by your health care provider. Where to find support Your health care provider can help you prevent hypertension and help you keep your blood pressure at a healthy level. Your local hospital or your community may also provide support services and prevention programs. The American Heart Association offers an online support network at: https://www.lee.net/ Where to find more information Learn more about hypertension from:  National Heart, Lung, and Blood Institute: https://www.peterson.org/  Centers for Disease Control and Prevention: AboutHD.co.nz  American Academy of Family  Physicians: http://familydoctor.org/familydoctor/en/diseases-conditions/high-blood-pressure.printerview.all.html Learn more about the DASH diet from:  National Heart, Lung, and Blood Institute: WedMap.it Contact a health care provider if:  You think you are having a reaction to medicines you have taken.  You have recurrent headaches or feel dizzy.  You have swelling in your ankles.  You have trouble with your vision. Summary  Hypertension often does not cause any symptoms until blood pressure is very high. It is important to get your blood pressure checked regularly.  Diet and lifestyle changes are the most important steps in preventing hypertension.  By keeping your blood pressure in a healthy range, you can prevent complications like heart attack, heart failure, stroke, and kidney failure.  Work with your health care provider to make a hypertension prevention plan that works for you.  This information is not intended to replace advice given to you by your health care provider. Make sure you discuss any questions you have with your health care provider. Document Released: 06/07/2015 Document Revised: 09/14/2018 Document Reviewed: 02/01/2016 Elsevier Patient Education  2020 [employer] How to Take Your Blood Pressure You can take your blood pressure at home with a machine. You may need to check your blood pressure at home:  To check if you have high blood pressure (hypertension).  To check your blood pressure over time.  To make sure your blood pressure medicine is working. Supplies needed: You will need a blood pressure machine, or monitor. You can buy one at a drugstore or online. When choosing one:  Choose one with an arm cuff.  Choose one that wraps around your upper arm. Only one finger should fit between your arm and the cuff.  Do not choose one that measures your blood pressure from your wrist or finger. Your doctor can  suggest a monitor. How to prepare Avoid these things for 30 minutes before checking your blood pressure:  Drinking caffeine.  Drinking alcohol.  Eating.  Smoking.  Exercising. Five minutes before checking your blood pressure:  Pee.  Sit in a dining chair. Avoid sitting in a soft couch or armchair.  Be quiet. Do not talk. How to take your blood pressure Follow the instructions that came with your machine. If you have a digital blood pressure monitor, these may be the instructions: 1. Sit up straight. 2. Place your feet on the floor. Do not cross your ankles or legs. 3. Rest your left arm at the level of your heart. You may rest it on a table, desk, or chair. 4. Pull up your shirt sleeve. 5. Wrap the blood pressure cuff around the upper part of your left arm. The cuff should be 1 inch (2.5 cm) above your elbow. It is best to wrap the cuff around bare skin. 6. Fit the cuff snugly around your arm. You should be able to place only one finger between the cuff and your arm. 7. Put the cord inside the groove of your elbow. 8. Press the power button. 9. Sit quietly while the cuff fills with air and loses air. 10. Write down the numbers on the screen. 11. Wait 2-3 minutes and then repeat steps 1-10. What do the numbers mean? Two numbers make up your blood pressure. The first number is called systolic pressure. The second is called diastolic pressure. An example of a blood pressure reading is "120 over 80" (or 120/80). If you are an adult and do not have a medical condition, use this guide to find out if your blood pressure is normal: Normal  First number: below 120.  Second number: below 80. Elevated  First number: 120-129.  Second number: below 80. Hypertension stage 1  First number: 130-139.  Second number: 80-89. Hypertension stage 2  First number: 140 or above.  Second number: 90 or above. Your blood pressure is above normal even if only the top or bottom number is  above normal. Follow these instructions at home:  Check your blood pressure as often as your doctor tells you to.  Take your monitor to your next doctor's appointment. Your doctor will: ? Make sure you are using it correctly. ? Make sure it is working right.  Make sure you understand what your blood pressure numbers should be.  Tell your doctor if your medicines are causing side effects. Contact a doctor if:  Your blood pressure keeps being high. Get help right away if:  Your first blood pressure number is higher than 180.  Your second blood pressure number is higher than 120. This information is not intended to replace advice given to you by your health care provider. Make sure you discuss any questions you have with your health care provider. Document Released: 05/05/2008 Document Revised: 05/05/2017 Document Reviewed: 10/30/2015 Elsevier Patient Education  2020 [employer]. Health Maintenance, Male Adopting a healthy lifestyle and getting preventive care are important in promoting health and wellness. Ask your health care provider about:  The right schedule for you to have regular tests and exams.  Things you can do on your own to prevent diseases and keep yourself healthy. What should I know about diet, weight, and exercise? Eat a healthy diet   Eat a diet that includes plenty of vegetables, fruits, low-fat dairy products, and lean protein.  Do not eat a lot of foods that are high in solid fats, added sugars, or sodium. Maintain a healthy weight Body mass index (BMI) is a measurement that can be used to identify possible weight problems. It estimates body fat based on height and weight. Your health care provider can help determine your BMI and help you achieve or maintain a healthy weight. Get regular exercise Get regular exercise. This is one of the most important things you can do for your health. Most adults should:  Exercise for at least 150 minutes each week. The  exercise should increase your heart rate and make you sweat (moderate-intensity exercise).  Do strengthening exercises at least twice a week. This is in addition to the moderate-intensity exercise.  Spend less time sitting. Even light physical activity can be beneficial. Watch cholesterol and blood lipids Have your blood tested for lipids and cholesterol at 47 years of age, then have this test every 5 years. You may need to have your cholesterol levels checked more often if:  Your lipid or cholesterol levels are high.  You are older than 47 years of age.  You are at high risk for heart disease. What should I know about cancer screening? Many types of cancers can be detected early and may often be prevented. Depending on your health history and family history, you may need to have cancer screening at various ages. This may include screening for:  Colorectal cancer.  Prostate cancer.  Skin cancer.  Lung cancer. What should I know about heart disease, diabetes, and high blood pressure? Blood pressure and heart disease  High blood pressure causes heart disease and increases the risk of stroke. This is more likely to develop in people who have high blood pressure readings, are of African descent, or are overweight.  Talk with your health care provider about your target blood pressure readings.  Have your blood pressure checked: ? Every 3-5 years if you are 68-97 years of age. ? Every year if you are 7 years old or older.  If you are between the ages of 60 and 67 and are a current or former smoker, ask your health care provider if you should have a one-time screening for abdominal aortic aneurysm (AAA). Diabetes Have regular diabetes screenings. This checks your fasting blood sugar level. Have the screening done:  Once every three years after age 32 if you are at a normal weight and have a low risk for diabetes.  More often and at a younger age if you are overweight or have a high  risk for diabetes.  What should I know about preventing infection? Hepatitis B If you have a higher risk for hepatitis B, you should be screened for this virus. Talk with your health care provider to find out if you are at risk for hepatitis B infection. Hepatitis C Blood testing is recommended for:  Everyone born from [birth year] through 1964.  Anyone with known risk factors for hepatitis C. Sexually transmitted infections (STIs)  You should be screened each year for STIs, including gonorrhea and chlamydia, if: ? You are sexually active and are younger than 47 years of age. ? You are older than 47 years of age and your health care provider tells you that you are at risk for this type of infection. ? Your sexual activity has changed since you were last screened, and you are at increased risk for chlamydia or gonorrhea. Ask your health care provider if you are at risk.  Ask your health care provider about whether you are at high risk for HIV. Your health care provider may recommend a prescription medicine to help prevent HIV infection. If you choose to take medicine to prevent HIV, you should first get tested for HIV. You should then be tested every 3 months for as long as you are taking the medicine. Follow these instructions at home: Lifestyle  Do not use any products that contain nicotine or tobacco, such as cigarettes, e-cigarettes, and chewing tobacco. If you need help quitting, ask your health care provider.  Do not use street drugs.  Do not share needles.  Ask your health care provider for help if you need support or information about quitting drugs. Alcohol use  Do not drink alcohol if your health care provider tells you not to drink.  If you drink alcohol: ? Limit how much you have to 0-2 drinks a day. ? Be aware of how much alcohol is in your drink. In the [place] one drink equals one 12 oz bottle of beer (355 mL), one 5 oz glass of wine (148 mL), or one 1 oz glass of hard liquor  (44 mL). General instructions  Schedule regular health, dental, and eye exams.  Stay current with your vaccines.  Tell your health care provider if: ? You often feel depressed. ? You have ever been abused or do not feel safe at home. Summary  Adopting a healthy lifestyle and getting preventive care are important in promoting health and wellness.  Follow your health care provider's instructions about healthy diet, exercising, and getting tested or screened for diseases.  Follow your health care provider's instructions on monitoring your cholesterol and blood pressure. This information is not intended to replace advice given to you by your health care provider. Make sure you discuss any questions you have with your health care provider. Document Released: 11/19/2007 Document Revised: 05/16/2018 Document Reviewed: 05/16/2018 Elsevier Patient Education  2020 Elsevier Inc. Calorie Counting for [employer] Loss Calories are units of energy. Your body needs a certain amount of calories from food to keep you going throughout the day. When you eat more calories than your body needs, your body stores the extra calories as fat. When you eat fewer calories than your body needs, your body burns fat to get the energy it needs. Calorie counting means keeping track of how many calories you eat and drink each day. Calorie counting can be helpful if you need to lose weight. If you make sure to eat fewer calories than your body needs, you should lose weight. Ask your health care provider  what a healthy weight is for you. For calorie counting to work, you will need to eat the right number of calories in a day in order to lose a healthy amount of weight per week. A dietitian can help you determine how many calories you need in a day and will give you suggestions on how to reach your calorie goal.  A healthy amount of weight to lose per week is usually 1-2 lb (0.5-0.9 kg). This usually means that your daily calorie  intake should be reduced by 500-750 calories.  Eating 1,200 - 1,500 calories per day can help most women lose weight.  Eating 1,500 - 1,800 calories per day can help most men lose weight. What is my plan? My goal is to have __________ calories per day. If I have this many calories per day, I should lose around __________ pounds per week. What do I need to know about calorie counting? In order to meet your daily calorie goal, you will need to:  Find out how many calories are in each food you would like to eat. Try to do this before you eat.  Decide how much of the food you plan to eat.  Write down what you ate and how many calories it had. Doing this is called keeping a food log. To successfully lose weight, it is important to balance calorie counting with a healthy lifestyle that includes regular activity. Aim for 150 minutes of moderate exercise (such as walking) or 75 minutes of vigorous exercise (such as running) each week. Where do I find calorie information?  The number of calories in a food can be found on a Nutrition Facts label. If a food does not have a Nutrition Facts label, try to look up the calories online or ask your dietitian for help. Remember that calories are listed per serving. If you choose to have more than one serving of a food, you will have to multiply the calories per serving by the amount of servings you plan to eat. For example, the label on a package of bread might say that a serving size is 1 slice and that there are 90 calories in a serving. If you eat 1 slice, you will have eaten 90 calories. If you eat 2 slices, you will have eaten 180 calories. How do I keep a food log? Immediately after each meal, record the following information in your food log:  What you ate. Don't forget to include toppings, sauces, and other extras on the food.  How much you ate. This can be measured in cups, ounces, or number of items.  How many calories each food and drink had.   The total number of calories in the meal. Keep your food log near you, such as in a small notebook in your pocket, or use a mobile app or website. Some programs will calculate calories for you and show you how many calories you have left for the day to meet your goal. What are some calorie counting tips?   Use your calories on foods and drinks that will fill you up and not leave you hungry: ? Some examples of foods that fill you up are nuts and nut butters, vegetables, lean proteins, and high-fiber foods like whole grains. High-fiber foods are foods with more than 5 g fiber per serving. ? Drinks such as sodas, specialty coffee drinks, alcohol, and juices have a lot of calories, yet do not fill you up.  Eat nutritious foods and avoid empty calories.  Empty calories are calories you get from foods or beverages that do not have many vitamins or protein, such as candy, sweets, and soda. It is better to have a nutritious high-calorie food (such as an avocado) than a food with few nutrients (such as a bag of chips).  Know how many calories are in the foods you eat most often. This will help you calculate calorie counts faster.  Pay attention to calories in drinks. Low-calorie drinks include water and unsweetened drinks.  Pay attention to nutrition labels for "low fat" or "fat free" foods. These foods sometimes have the same amount of calories or more calories than the full fat versions. They also often have added sugar, starch, or salt, to make up for flavor that was removed with the fat.  Find a way of tracking calories that works for you. Get creative. Try different apps or programs if writing down calories does not work for you. What are some portion control tips?  Know how many calories are in a serving. This will help you know how many servings of a certain food you can have.  Use a measuring cup to measure serving sizes. You could also try weighing out portions on a kitchen scale. With time, you  will be able to estimate serving sizes for some foods.  Take some time to put servings of different foods on your favorite plates, bowls, and cups so you know what a serving looks like.  Try not to eat straight from a bag or box. Doing this can lead to overeating. Put the amount you would like to eat in a cup or on a plate to make sure you are eating the right portion.  Use smaller plates, glasses, and bowls to prevent overeating.  Try not to multitask (for example, watch TV or use your computer) while eating. If it is time to eat, sit down at a table and enjoy your food. This will help you to know when you are full. It will also help you to be aware of what you are eating and how much you are eating. What are tips for following this plan? Reading food labels  Check the calorie count compared to the serving size. The serving size may be smaller than what you are used to eating.  Check the source of the calories. Make sure the food you are eating is high in vitamins and protein and low in saturated and trans fats. Shopping  Read nutrition labels while you shop. This will help you make healthy decisions before you decide to purchase your food.  Make a grocery list and stick to it. Cooking  Try to cook your favorite foods in a healthier way. For example, try baking instead of frying.  Use low-fat dairy products. Meal planning  Use more fruits and vegetables. Half of your plate should be fruits and vegetables.  Include lean proteins like poultry and fish. How do I count calories when eating out?  Ask for smaller portion sizes.  Consider sharing an entree and sides instead of getting your own entree.  If you get your own entree, eat only half. Ask for a box at the beginning of your meal and put the rest of your entree in it so you are not tempted to eat it.  If calories are listed on the menu, choose the lower calorie options.  Choose dishes that include vegetables, fruits, whole  grains, low-fat dairy products, and lean protein.  Choose items that are boiled, broiled,  grilled, or steamed. Stay away from items that are buttered, battered, fried, or served with cream sauce. Items labeled "crispy" are usually fried, unless stated otherwise.  Choose water, low-fat milk, unsweetened iced tea, or other drinks without added sugar. If you want an alcoholic beverage, choose a lower calorie option such as a glass of wine or light beer.  Ask for dressings, sauces, and syrups on the side. These are usually high in calories, so you should limit the amount you eat.  If you want a salad, choose a garden salad and ask for grilled meats. Avoid extra toppings like bacon, cheese, or fried items. Ask for the dressing on the side, or ask for olive oil and vinegar or lemon to use as dressing.  Estimate how many servings of a food you are given. For example, a serving of cooked rice is  cup or about the size of half a baseball. Knowing serving sizes will help you be aware of how much food you are eating at restaurants. The list below tells you how big or small some common portion sizes are based on everyday objects: ? 1 oz-4 stacked dice. ? 3 oz-1 deck of cards. ? 1 tsp-1 die. ? 1 Tbsp- a ping-pong ball. ? 2 Tbsp-1 ping-pong ball. ?  cup- baseball. ? 1 cup-1 baseball. Summary  Calorie counting means keeping track of how many calories you eat and drink each day. If you eat fewer calories than your body needs, you should lose weight.  A healthy amount of weight to lose per week is usually 1-2 lb (0.5-0.9 kg). This usually means reducing your daily calorie intake by 500-750 calories.  The number of calories in a food can be found on a Nutrition Facts label. If a food does not have a Nutrition Facts label, try to look up the calories online or ask your dietitian for help.  Use your calories on foods and drinks that will fill you up, and not on foods and drinks that will leave you hungry.   Use smaller plates, glasses, and bowls to prevent overeating. This information is not intended to replace advice given to you by your health care provider. Make sure you discuss any questions you have with your health care provider. Document Released: 05/23/2005 Document Revised: 02/09/2018 Document Reviewed: 04/22/2016 Elsevier Patient Education  2020 [employer]

## 2019-01-24 NOTE — Progress Notes (Signed)
[facility]  KnippaJason Medina DOB: 48 y.o. MRN: [id number]  Subjective:  Here for Biometric Screen/brief exam Patient is a 48 year old male in no acute distress who comes in the clinic for a biometric brief exam with biometric screening for his employer on its [place]. 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  He reports he has been very busy trying to get 85% of the staff to work from home and this has taken a lot on his end.  He reports that prior to COVID-19 pandemic he was doing very well with his diet and exercise, he however has gained 30 pounds in the last 4 to 5 months.  He has started to make changes with his diet eating more healthy and drinking more water in the last month and does notice a difference in the way he feels. He reports he seen his primary care provider recently and his blood pressure was normal, discussed today that is 138/90 and should monitor at home and report any abnormals to his primary care provider, BMI calculated for today and is over normal range. Patient reports he feels very well. Patient  denies any fever, body aches,chills, rash, chest pain, shortness of breath, nausea, vomiting, or diarrhea.  Denies any edema or swelling.   Objective: Blood pressure 138/90, pulse 83, temperature 98.1 F (36.7 C), temperature source Temporal, resp. rate 18, height 6\' 1"  (1.854 m), weight 292 lb (132.5 kg), SpO2 98 %.Body mass index is 38.52 kg/m.  NAD well-developed well-nourished HEENT: Within normal limits Neck: Normal, supple, no lymphadenopathy, no supraclavicular lymphadenopathy Heart: Regular rate and rhythm no murmurs rubs or gallops Lungs: Clear to auscultation without any adventitious lung sounds. Skin: What is exposed during exam appears to be normal patient has no concerns regarding his skin, he was not completely undressed for gown for this brief exam Assessment:  Biometric  screen    ICD-10-CM   1. Encounter for other general examination-biometric exam brief and biometric screening-this is not a full annual physical.  Z00.8 Glucose, random    Lipid Panel With LDL/HDL Ratio  2. Encounter for biometric screening  Z01.89 Glucose, random    Lipid Panel With LDL/HDL Ratio    Plan: Follow-up with your primary care provider as he is instructed, follow closely with your weight and your blood pressure and call him should any changes occur. Maintain a heart healthy diet with low sodium, increased exercise and aggressive diet and lifestyle changes. Fasting glucose and lipids. Discussed with patient that today's visit here is a limited biometric screening visit (not a comprehensive exam or management of any chronic problems) Discussed some health issues, including healthy eating habits and exercise. Encouraged to follow-up with PCP for annual comprehensive preventive and wellness care (and if applicable, any chronic issues). Questions invited and answered.  I will have the office call you on your glucose and cholesterol results when they return if you have not heard within 1 week please call the office.  This biometric physical is a brief physical and the only labs done are glucose and your lipid panel(cholesterol) and is  not a substitute for seeing a primary care provider for a complete annual physical. Please see a primary care physician for routine health maintenance, labs and full physical at least yearly and follow up as recommended by your provider. Provider also recommends if you do not have a primary care provider for patient to establish care as soon as possible .Patient  may chose provider of choice. Also gave the [facility] at [phone] or web site at [email or web address] to help assist with finding a primary care doctor.  Patient verbalizes understanding that his office is acute care only and not a substitute for a primary care  or for the management of chronic conditions.

## 2019-01-25 ENCOUNTER — Encounter: Payer: Self-pay | Admitting: Adult Health

## 2019-01-25 LAB — LIPID PANEL WITH LDL/HDL RATIO
Cholesterol, Total: 192 mg/dL (ref 100–199)
HDL: 38 mg/dL — ABNORMAL LOW (ref >39)
LDL Calculated: 135 mg/dL — ABNORMAL HIGH (ref 0–99)
LDl/HDL Ratio: 3.6 ratio (ref 0.0–3.6)
Triglycerides: 94 mg/dL (ref 0–149)
VLDL Cholesterol Cal: 19 mg/dL (ref 5–40)

## 2019-01-25 LAB — GLUCOSE, RANDOM: Glucose: 107 mg/dL — ABNORMAL HIGH (ref 65–99)

## 2019-08-07 IMAGING — CR DG ABDOMEN 1V
1 series · 2 of 2 positions shown · non-contrast
Comparison: None.

CLINICAL DATA: Low back pain for 2 days, constipation, blood in
urine.

EXAM:
ABDOMEN - 1 VIEW

[Series 1: dg abd 1 view · 0.14mm/px · 2 of 2 slices shown]
[im 1/2]
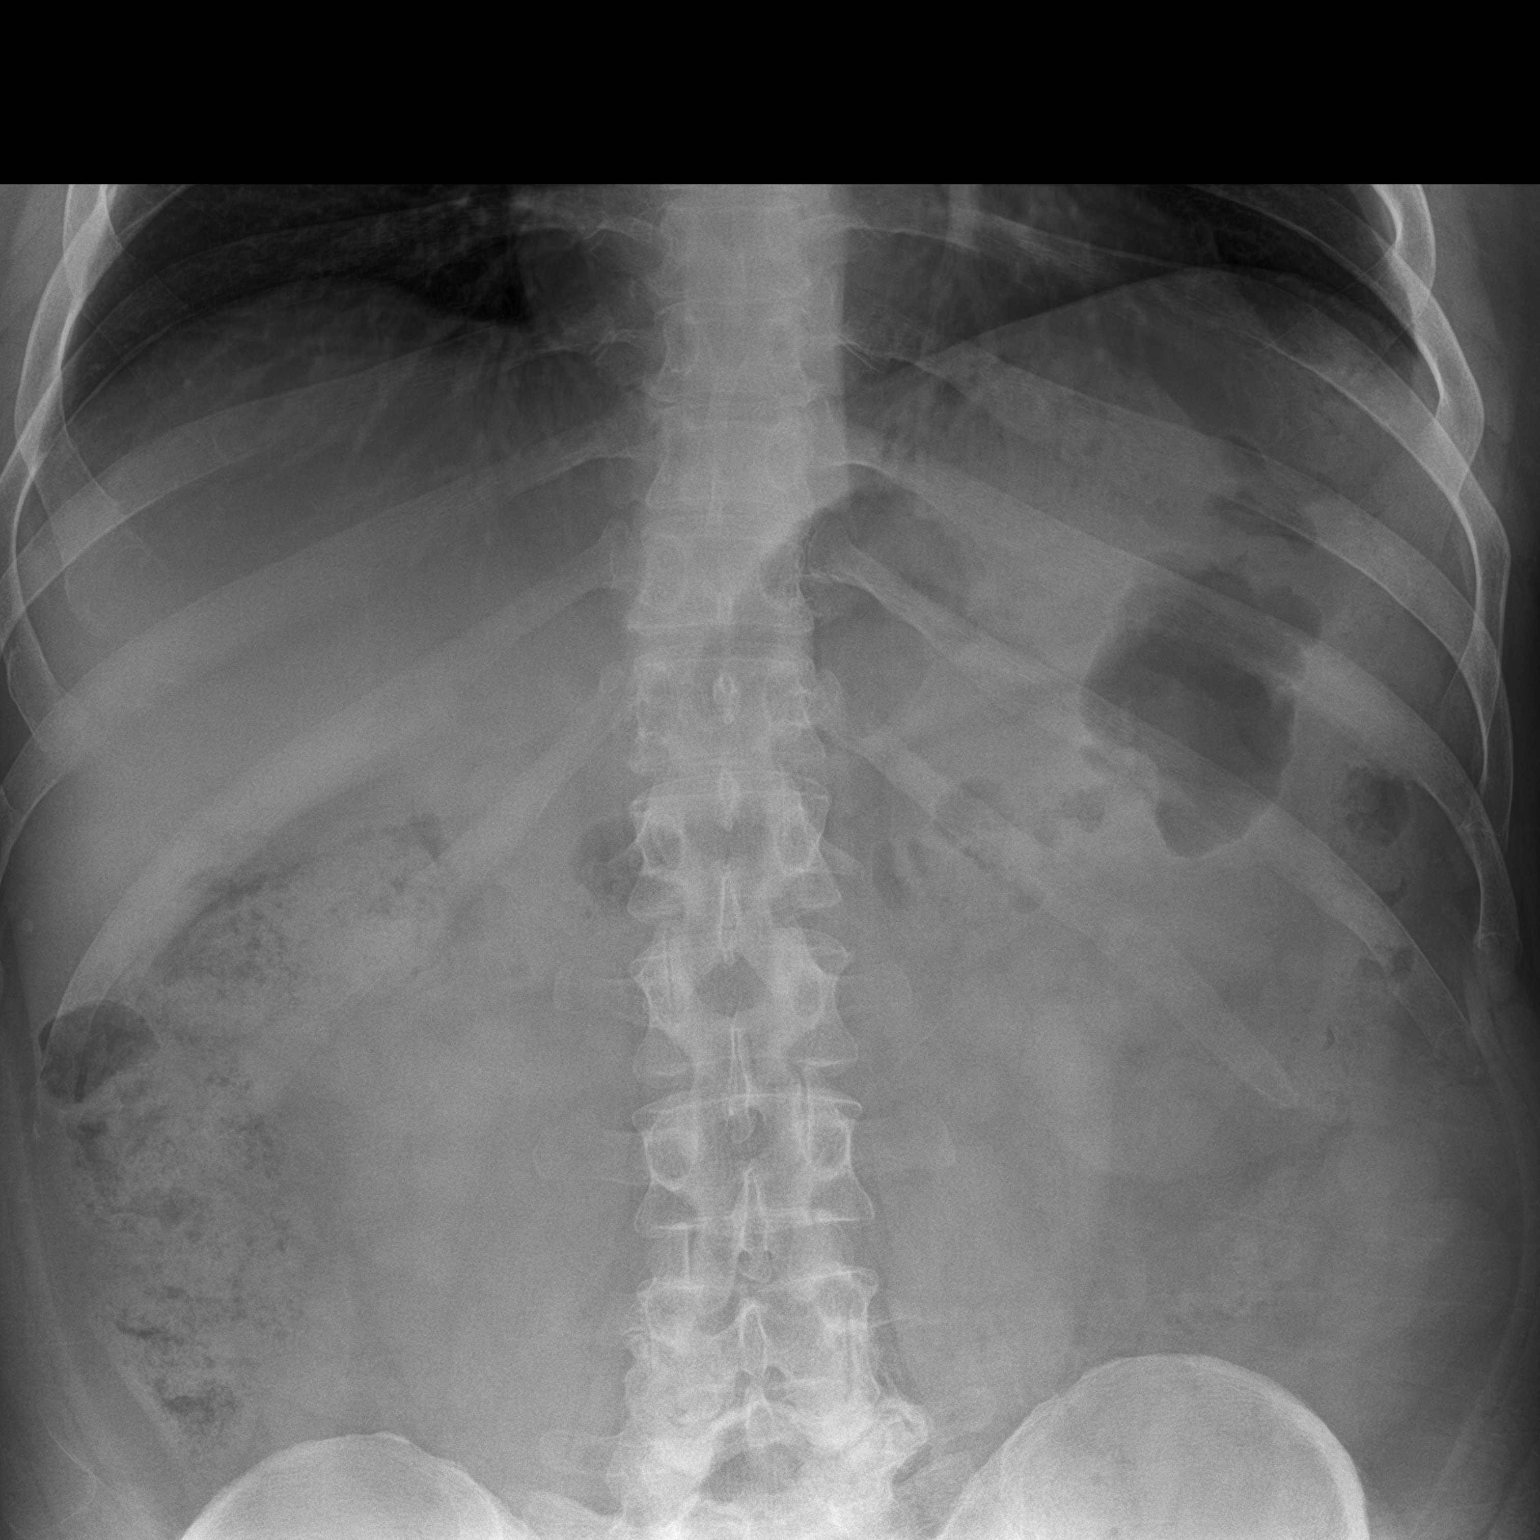
[im 2/2]
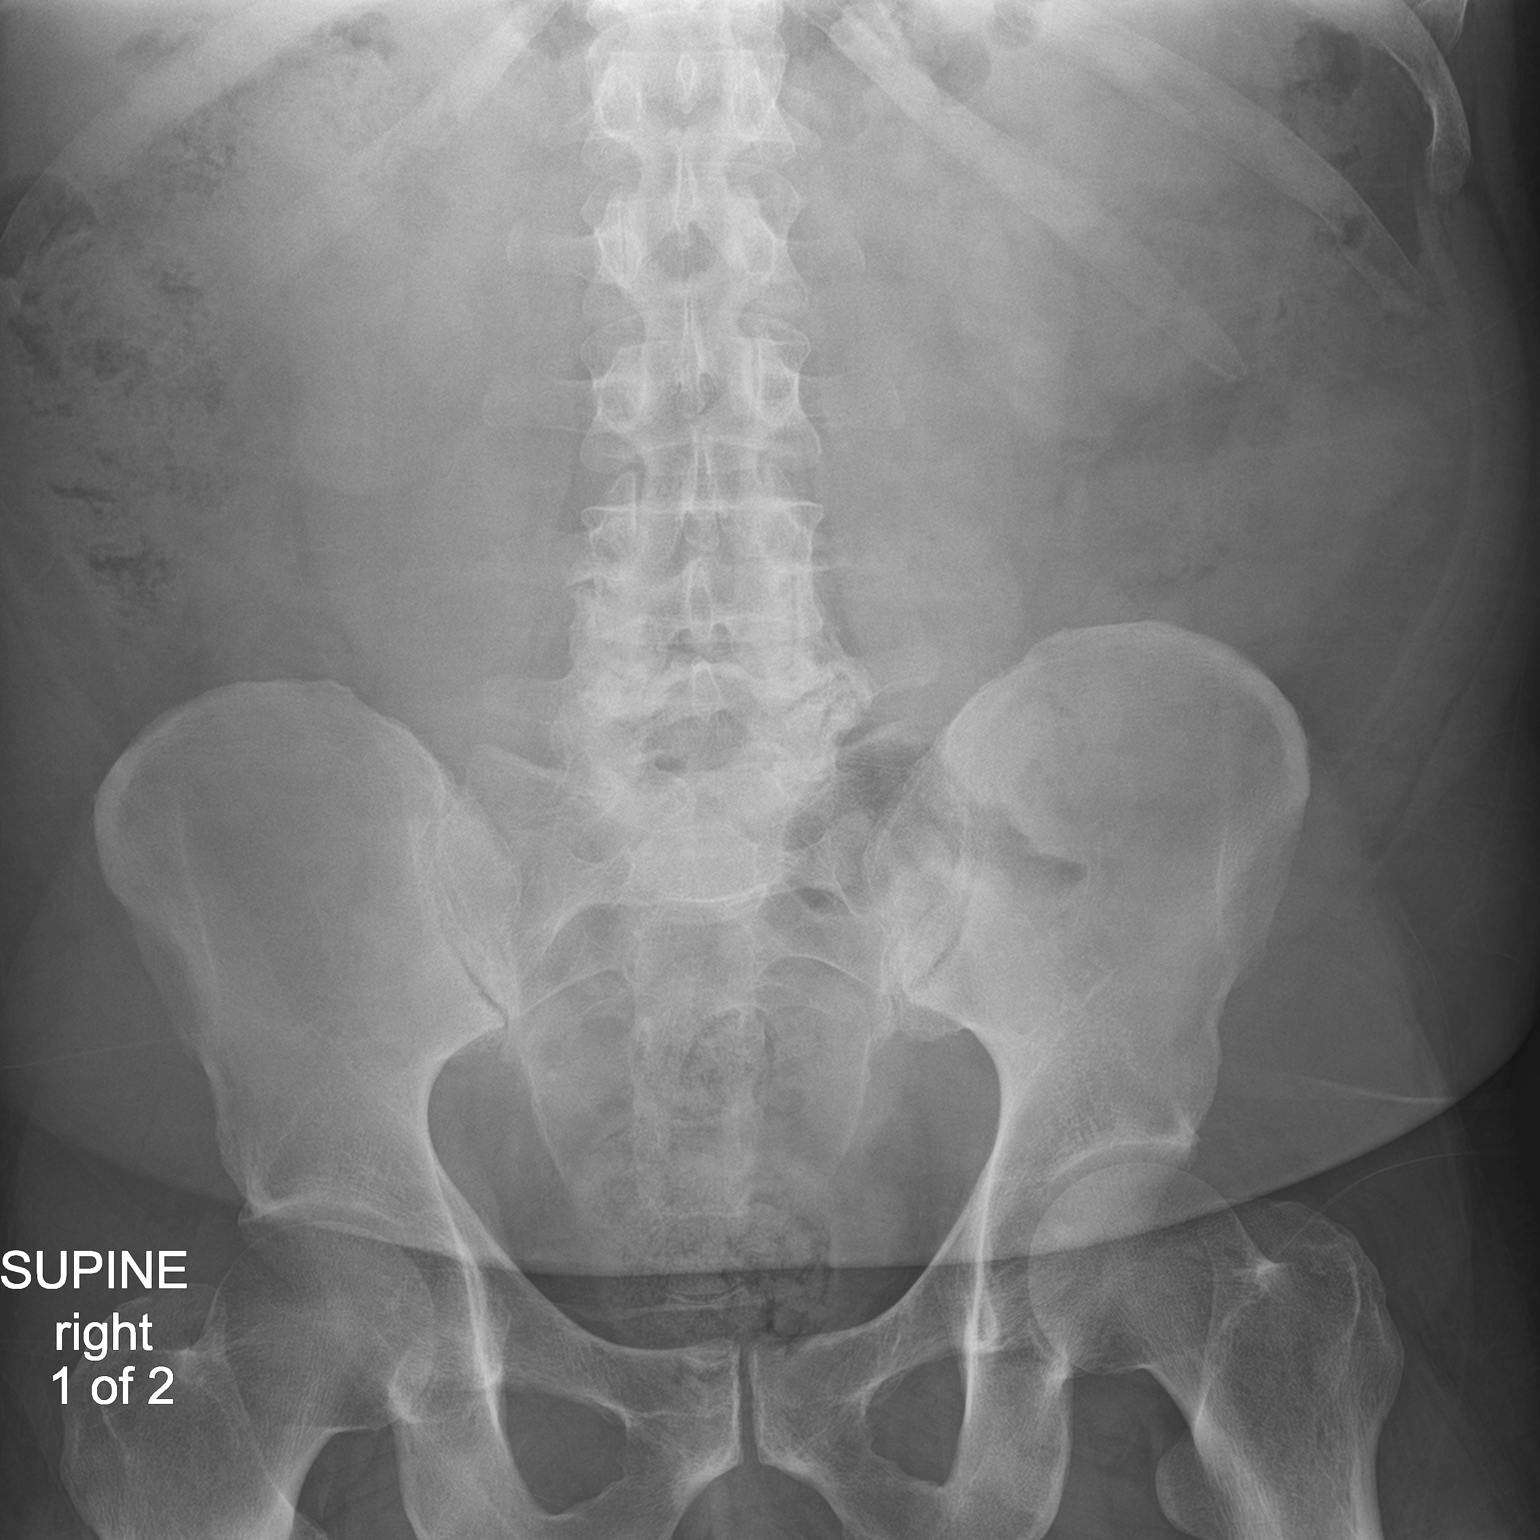

[2 of 2 positions shown; findings below may reference images not displayed]

FINDINGS: Bowel gas pattern is nonobstructive. Moderate amount of stool within
the colon. No evidence of soft tissue mass or abnormal fluid
collection. No evidence of free intraperitoneal air. No evidence of
renal or ureteral calculi. Osseous structures are unremarkable. Lung
bases appear clear.
IMPRESSION: Negative.

## 2020-02-21 ENCOUNTER — Other Ambulatory Visit: Payer: Self-pay

## 2020-02-21 ENCOUNTER — Ambulatory Visit: Payer: Managed Care, Other (non HMO) | Admitting: Nurse Practitioner

## 2020-02-21 ENCOUNTER — Encounter: Payer: Self-pay | Admitting: Nurse Practitioner

## 2020-02-21 VITALS — BP 117/81 | HR 74 | Temp 98.2°F | Resp 16 | Ht 73.0 in | Wt 274.0 lb

## 2020-02-21 DIAGNOSIS — Z0000 Encounter for general adult medical examination without abnormal findings: Secondary | ICD-10-CM | POA: Diagnosis not present

## 2020-02-21 DIAGNOSIS — Z008 Encounter for other general examination: Secondary | ICD-10-CM | POA: Diagnosis not present

## 2020-02-21 NOTE — Progress Notes (Signed)
Subjective:     Patient ID: Jorge Medina, male   DOB: Oct 06, 1970, 49 y.o.   MRN: [id number]  HPI Kue Fox is a 49 y.o. male who presents to the [facility] for his annual biometric screening exam. He is employed in the [employer] and has been there for 7 years. He works with [employer]. He reports being over weight and has started a keto diet and exercise program. So far he has lost 25 pounds. He denies any problems today.   PMH: Anxiety, male hypogonadism, agoraphobia, Rosacea Surg: None SH: no tobacco, ETOH or drug use Immunization: UTD Diet/exercise: Keto diet, exercise 2 x week  Meds: Albuterol inhaler, Klonopin, prednisone, Zoloft Allergies: Nalbuphine  Review of Systems  Skin:       Hx of Rosacea   Psychiatric/Behavioral:       Hx of panic attacks  All other systems reviewed and are negative.      Objective: BP 117/81 (BP Location: Right Arm, Patient Position: Sitting, Cuff Size: Normal)   Pulse 74   Temp 98.2 F (36.8 C) (Temporal)   Resp 16   Ht 6\' 1"  (1.854 m)   Wt 274 lb (124.3 kg)   SpO2 98%   BMI 36.15 kg/m     Physical Exam Constitutional:      Appearance: He is obese.  HENT:     Head: Normocephalic and atraumatic.     Right Ear: Tympanic membrane, ear canal and external ear normal.     Left Ear: Tympanic membrane, ear canal and external ear normal.     Nose: Nose normal.     Mouth/Throat:     Mouth: Mucous membranes are moist.     Pharynx: Oropharynx is clear.  Eyes:     Extraocular Movements: Extraocular movements intact.     Conjunctiva/sclera: Conjunctivae normal.     Pupils: Pupils are equal, round, and reactive to light.  Neck:     Thyroid: No thyroid mass or thyroid tenderness.     Vascular: Normal carotid pulses. No carotid bruit or JVD.     Trachea: Trachea normal.  Cardiovascular:     Rate and Rhythm: Normal rate and regular rhythm.     Heart sounds: No murmur heard.   Pulmonary:     Effort: Pulmonary effort is normal.     Breath  sounds: Normal breath sounds and air entry.  Abdominal:     Tenderness: There is no abdominal tenderness. There is no right CVA tenderness or left CVA tenderness.  Musculoskeletal:     Cervical back: Normal range of motion. No muscular tenderness.  Skin:    General: Skin is warm and dry.  Neurological:     Mental Status: He is alert.     Cranial Nerves: Cranial nerves are intact.     Motor: Motor function is intact.     Coordination: Romberg sign negative.     Gait: Gait is intact.     Deep Tendon Reflexes:     Reflex Scores:      Bicep reflexes are 2+ on the right side and 2+ on the left side.      Brachioradialis reflexes are 2+ on the right side and 2+ on the left side.      Patellar reflexes are 2+ on the right side and 2+ on the left side.    Comments: Ambulatory with steady gait. Stands on one foot without difficulty. Grips are equal, radial pulses 2+.   Psychiatric:  Attention and Perception: Attention normal.        Speech: Speech normal.        Behavior: Behavior normal.        Thought Content: Thought content normal.        Assessment:    1. Encounter for other general examination  2. Encounter for biometric screening - Glucose, random - Lipid panel  3. Encounter for preventive health examination     Plan:   Discussed diet and exercise, immunizations and general health plan. Employee given opportunity to ask questions. All questions answered. Employee voices understanding. F/u in one year or sooner if problems.

## 2020-02-21 NOTE — Patient Instructions (Addendum)
Immunization Schedule, 30-48 Years Old  Vaccines are usually given at various ages, according to a schedule. Your health care provider will recommend vaccines for you based on your age, medical history, and lifestyle or other factors, such as travel or where you work. You may receive vaccines as individual doses or as more than one vaccine together in one shot (combination vaccines). Talk with your health care provider about the risks and benefits of combination vaccines. Recommended immunizations for 28-26 years old Influenza vaccine  You should get a dose of the influenza vaccine every year. Tetanus, diphtheria, and pertussis vaccine A vaccine that protects against tetanus, diphtheria, and pertussis is known as the Tdap vaccine. A vaccine that protects against tetanus and diphtheria is known as the Td vaccine.  You should only get the Td vaccine if you have had at least 1 dose of the Tdap vaccine.  You should get 1 dose of the Td or Tdap vaccine every 10 years, or you should get 1 dose of the Tdap vaccine if: ? You have not previously gotten a Tdap vaccine. ? You do not know if you have ever gotten a Tdap vaccine. ? You are between 27 and [redacted] weeks pregnant. Measles, mumps, and rubella vaccine This is also known as the MMR vaccine. You may need to get the MMR vaccine if:  You need to catch up on doses you missed in the past.  You have not been given the vaccine before.  You do not have evidence of immunity (by a blood test). Pregnant women should not get the MMR vaccine during pregnancy because it may be harmful to the unborn baby. However, if you are not immune to measles, mumps, or rubella, you should get a dose of MMR vaccine one month or more before pregnancy or within days after delivery. Varicella vaccine This is also known as the VAR vaccine. You may need to get the VAR vaccine if you were born in 1980 or later and:  You need to catch up on doses you missed in the past.  You  have not been given the vaccine before.  You do not have evidence of immunity (by a blood test).  You have certain high-risk conditions, such as HIV or AIDS. Pregnant women should not get the VAR vaccine during pregnancy because it may be harmful to the unborn baby. However, if you are not immune to chickenpox (varicella), you should get a dose of the VAR vaccine within days after delivery. Human papillomavirus vaccine This is also known as the HPV vaccine. If you have not gotten the vaccine before or you missed doses in the past, talk to your health care provider about whether it is appropriate for you to get the HPV vaccine. Pneumococcal conjugate vaccine This is also known as the PCV13 vaccine. You should get the PCV13 vaccine as recommended if you have certain high-risk conditions. These include:  Diabetes.  Chronic conditions of the heart, lungs, or liver.  Conditions that affect the body's disease-fighting system (immune system). Pneumococcal polysaccharide vaccine This is also known as the PPSV23 vaccine. You should get the PPSV23 vaccine as recommended if you have certain high-risk conditions. These include:  Diabetes.  Chronic conditions of the heart, lungs, or liver.  Conditions that affect the immune system. Hepatitis A vaccine This is also known as the HepA vaccine. If you did not get the HepA vaccine previously, you should get it if:  You are at risk for a hepatitis A infection. You  may be at risk for infection if you: ? Have chronic liver disease. ? Have HIV or AIDS. ? Are a man who has sex with men. ? Use drugs. ? Are homeless. ? May be exposed to hepatitis A through work. ? Travel to countries where hepatitis A is common. ? Are pregnant. ? Have or will have close contact with someone who was adopted from another country.  You are not at risk for infection but want protection from hepatitis A. Hepatitis B vaccine This is also known as the HepB vaccine. If you  did not get the HepB vaccine previously, you should get it if:  You are at risk for hepatitis B infection. You are at risk if you: ? Have chronic liver disease. ? Have HIV or AIDS. ? Have sex with a partner who has hepatitis B, or:  You have multiple sex partners.  You are a man who has sex with men. ? Use drugs. ? May be exposed to hepatitis B through work. ? Live with someone who has hepatitis B. ? Receive dialysis treatment. ? Have diabetes. ? Travel to countries where hepatitis B is common. ? Are pregnant.  You are not at risk of infection but want protection from hepatitis B. Meningococcal conjugate vaccine This is also known as the MenACWY vaccine. You may need to get the MenACWY vaccine if you:  Have not been given the vaccine before.  Need to catch up on doses you missed in the past. This vaccine is especially important if you:  Do not have a spleen.  Have sickle cell disease.  Have HIV.  Take medicines that suppress your immune system.  Travel to countries where meningococcal disease is common.  Are exposed to Neisseria meningitidis at work. Serogroup B meningococcal vaccine This is also known as the MenB vaccine. You may need to get the MenB vaccine if you:  Have not been given the vaccine before.  Need to catch up on doses you missed in the past. This vaccine is especially important if you:  Do not have a spleen.  Have sickle cell disease.  Take medicines that suppress your immune system.  Are exposed to Neisseria meningitidis at work. Haemophilus influenzae type b vaccine This is also known as the Hib vaccine. Anyone older than 48 years of age is usually not given the Hib vaccine. However, if you have certain high-risk conditions, you may need to get this vaccine. These conditions include:  Not having a spleen.  Having received a stem cell transplant. Before you get a vaccine: Talk with your health care provider about which vaccines are right for  you. This is especially important if:  You previously had a reaction after getting a vaccine.  You have a weakened immune system. You may have a weakened immune system if you: ? Are taking medicines that reduce (suppress) the activity of your immune system. ? Are taking medicines to treat cancer (chemotherapy). ? Have HIV or AIDS.  You work in an environment where you may be exposed to a disease.  You plan to travel outside of the country.  You have a chronic illness, such as heart disease, kidney disease, diabetes, or lung disease.  You are pregnant, think you may be pregnant, or are planning to become pregnant. Summary  Before you get a vaccine, tell your health care provider if you have reacted to vaccines in the past or have a condition that weakens your immune system.  At 27-49 years, you should get  a dose of the influenza vaccine every year and a dose of the Td or Tdap vaccine every 10 years.  Depending on your medical history and your risk factors, you may need other vaccines. Ask your health care provider whether you are up to date on all your vaccines.  Women who are pregnant may not receive certain vaccines. Ask your health care provider whether you should receive any vaccines soon after you deliver your baby. This information is not intended to replace advice given to you by your health care provider. Make sure you discuss any questions you have with your health care provider. Document Revised: 03/20/2019 Document Reviewed: 03/20/2019 Elsevier Patient Education  [place] Screening Exam  A medical screening exam helps determine whether or not you need immediate medical treatment. This type of exam may be done in the emergency department, an urgent care setting, or your health care provider's office. During the exam, a health care provider does a short physical exam and asks about your medical history to assess:  Your current symptoms.  Your overall  health. Depending on your symptoms, you may need additional tests. What are the possible outcomes of a medical screening exam? Your medical screening exam may determine that:  You do not need emergency treatment at this time.  You need treatment right away.  You need to be transferred to another medical center.  You need to have more tests. A medical specialist may be consulted if necessary. When should I seek medical care? If you have a regular health care provider, make an appointment for a follow-up visit with him or her. If you do not have a regular health care provider, ask about resources in your community. Get help right away if:  Your condition gets worse or you develop new or troubling symptoms before you see your health care provider. If this occurs, go to an emergency department right away. In an emergency:  Call 911 or have someone drive you to the nearest hospital.  Do not drive yourself. Summary  A medical screening exam helps determine whether or not you need immediate medical treatment.  During the exam, a health care provider does a short physical exam and asks about your current symptoms and overall health.  More tests may be ordered during the exam.  You may need to be transferred to another medical center. This information is not intended to replace advice given to you by your health care provider. Make sure you discuss any questions you have with your health care provider. Document Revised: 09/14/2018 Document Reviewed: 12/19/2017 Elsevier Patient Education  [place].

## 2020-02-22 LAB — LIPID PANEL
Chol/HDL Ratio: 5.1 ratio — ABNORMAL HIGH (ref 0.0–5.0)
Cholesterol, Total: 187 mg/dL (ref 100–199)
HDL: 37 mg/dL — ABNORMAL LOW (ref >39)
LDL Chol Calc (NIH): 138 mg/dL — ABNORMAL HIGH (ref 0–99)
Triglycerides: 64 mg/dL (ref 0–149)
VLDL Cholesterol Cal: 12 mg/dL (ref 5–40)

## 2020-02-22 LAB — GLUCOSE, RANDOM: Glucose: 94 mg/dL (ref 65–99)

## 2020-02-26 ENCOUNTER — Ambulatory Visit
Admission: EM | Admit: 2020-02-26 | Discharge: 2020-02-26 | Disposition: A | Discharge status: Home / Self Care | Payer: Managed Care, Other (non HMO) | Attending: Emergency Medicine | Admitting: Emergency Medicine

## 2020-02-26 DIAGNOSIS — R0789 Other chest pain: Secondary | ICD-10-CM

## 2020-02-26 MED ORDER — NAPROXEN 500 MG PO TABS: 500.0000 mg | ORAL_TABLET | Freq: Two times a day (BID) | ORAL | 0 refills | Status: AC

## 2020-02-26 NOTE — Discharge Instructions (Addendum)
EKG normal Naprosyn twice daily as needed for chest wall inflammation  Follow up if anything worsening or changing

## 2020-02-26 NOTE — ED Provider Notes (Signed)
[facility]    CSN: [id number] Arrival date & time: 02/26/20  1834      History   Chief Complaint Chief Complaint  Patient presents with  . Chest Pain    HPI Jorge Medina is a 49 y.o. male presenting today for evaluation of chest pain.  Patient reports that he has had right upper chest pain since Sunday for the past 4 days.  Reports a dull soreness in this area.  Denies exertional chest pain.  Slightly worse with certain movements.  Does report lifting a TV prior to onset of symptoms.  Also reports significant history of anxiety and recently attended funerals and feels his anxiety has been increased of recently.  Has been applying ice packs and took a Klonopin earlier today which has slightly symptoms.  Denies any history of hypertension, diabetes or tobacco use.  Denies prior DVT/PE.  Denies leg pain or leg swelling.   HPI  Past Medical History:  Diagnosis Date  . Anxiety     Patient Active Problem List   Diagnosis Date Noted  . 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 Chronic anxiety 06/01/2017  . Panic disorder without agoraphobia 06/01/2017  . Rosacea 06/01/2017    History reviewed. No pertinent surgical history.     Home Medications    Prior to Admission medications   Medication Sig Start Date End Date Taking? Authorizing Provider  clonazePAM (KLONOPIN) 0.5 MG tablet Take by mouth. 08/03/17   [provider]  naproxen (NAPROSYN) 500 MG tablet Take 1 tablet (500 mg total) by mouth 2 (two) times daily. 02/26/20   Kaemon Barnett C, PA-C  sertraline (ZOLOFT) 100 MG tablet Take 100 mg by mouth daily. 11/10/15   [provider]    Family History Family History  Problem Relation Age of Onset  . Cancer Neg Hx   . Early death Neg Hx   . Hypertension Neg Hx   . Heart disease Neg Hx   . Stroke Neg Hx     Social History Social History   Tobacco Use  . Smoking status: Former Games developer  . Smokeless tobacco: Never Used  . Tobacco comment: quit 14  years ago, smoked 1ppd for 3 years  Substance Use Topics  . Alcohol use: No    Alcohol/week: 0.0 standard drinks  . Drug use: No     Allergies   Nalbuphine   Review of Systems Review of Systems  Constitutional: Negative for fatigue and fever.  HENT: Negative for congestion, sinus pressure and sore throat.   Eyes: Negative for photophobia, pain and visual disturbance.  Respiratory: Negative for cough and shortness of breath.   Cardiovascular: Positive for chest pain. Negative for leg swelling.  Gastrointestinal: Negative for abdominal pain, nausea and vomiting.  Genitourinary: Negative for decreased urine volume and hematuria.  Musculoskeletal: Negative for myalgias, neck pain and neck stiffness.  Neurological: Negative for dizziness, syncope, facial asymmetry, speech difficulty, weakness, light-headedness, numbness and headaches.  Psychiatric/Behavioral: The patient is nervous/anxious.      Physical Exam Triage Vital Signs ED Triage Vitals  Enc Vitals Group     BP 02/26/20 1849 (!) 138/98     Pulse Rate 02/26/20 1849 84     Resp 02/26/20 1849 18     Temp 02/26/20 1849 98.5 F (36.9 C)     Temp Source 02/26/20 1849 Oral     SpO2 02/26/20 1849 96 %     Weight --      Height --  Head Circumference --      Peak Flow --      Pain Score 02/26/20 1850 3     Pain Loc --      Pain Edu? --      Excl. in GC? --    No data found.  Updated Vital Signs BP (!) 138/98 (BP Location: Left Arm)   Pulse 84   Temp 98.5 F (36.9 C) (Oral)   Resp 18   SpO2 96%   Visual Acuity Right Eye Distance:   Left Eye Distance:   Bilateral Distance:    Right Eye Near:   Left Eye Near:    Bilateral Near:     Physical Exam Vitals and nursing note reviewed.  Constitutional:      Appearance: He is well-developed.     Comments: No acute distress  HENT:     Head: Normocephalic and atraumatic.     Nose: Nose normal.  Eyes:     Extraocular Movements: Extraocular movements intact.       Conjunctiva/sclera: Conjunctivae normal.     Pupils: Pupils are equal, round, and reactive to light.  Cardiovascular:     Rate and Rhythm: Normal rate and regular rhythm.  Pulmonary:     Effort: Pulmonary effort is normal. No respiratory distress.     Comments: Breathing comfortably at rest, CTABL, no wheezing, rales or other adventitious sounds auscultated  No reproducible tenderness to palpation Abdominal:     General: There is no distension.  Musculoskeletal:        General: Normal range of motion.     Cervical back: Neck supple.  Skin:    General: Skin is warm and dry.  Neurological:     General: No focal deficit present.     Mental Status: He is alert and oriented to person, place, and time. Mental status is at baseline.     Cranial Nerves: No cranial nerve deficit.     Motor: No weakness.     Gait: Gait normal.      UC Treatments / Results  Labs (all labs ordered are listed, but only abnormal results are displayed) Labs Reviewed - No data to display  EKG   Radiology No results found.  Procedures Procedures (including critical care time)  Medications Ordered in UC Medications - No data to display  Initial Impression / Assessment and Plan / UC Course  I have reviewed the triage vital signs and the nursing notes.  Pertinent labs & imaging results that were available during my care of the patient were reviewed by me and considered in my medical decision making (see chart for details).     EKG normal sinus rhythm, no acute signs of ischemia or infarction.  Negative risk factors for cardiac etiology, low suspicion of ACS.  PERC negative.  Suspect likely anxiety versus chest wall inflammation.  Recommending continue anxiety medicines as prescribed as well as anti-inflammatories as needed.  Discussed strict return precautions. Patient verbalized understanding and is agreeable with plan.  Final Clinical Impressions(s) / UC Diagnoses   Final diagnoses:   Atypical chest pain     Discharge Instructions     EKG normal Naprosyn twice daily as needed for chest wall inflammation  Follow up if anything worsening or changing    ED Prescriptions    Medication Sig Dispense Auth. Provider   naproxen (NAPROSYN) 500 MG tablet Take 1 tablet (500 mg total) by mouth 2 (two) times daily. 30 tablet [place], [place] C, PA-C  PDMP not reviewed this encounter.   Lew Dawes, [place] 02/26/20 [phone]

## 2020-02-26 NOTE — ED Triage Notes (Signed)
Pt c/o rt upper chest pain since sunday. States has anxiety and went to a funeral Sunday so he is worried. States took his klonopin this afternoon with so relief. Denies SOB/diaphroesis/pain radiating.

## 2020-09-14 ENCOUNTER — Other Ambulatory Visit: Payer: Self-pay

## 2020-09-14 ENCOUNTER — Other Ambulatory Visit: Payer: Managed Care, Other (non HMO)

## 2020-09-14 DIAGNOSIS — Z13220 Encounter for screening for lipoid disorders: Secondary | ICD-10-CM

## 2020-09-14 DIAGNOSIS — Z0000 Encounter for general adult medical examination without abnormal findings: Secondary | ICD-10-CM

## 2020-09-14 DIAGNOSIS — E291 Testicular hypofunction: Secondary | ICD-10-CM | POA: Diagnosis not present

## 2020-09-16 LAB — COMPREHENSIVE METABOLIC PANEL
ALT: 19 IU/L (ref 0–44)
AST: 18 IU/L (ref 0–40)
Albumin/Globulin Ratio: 1.7 (ref 1.2–2.2)
Albumin: 4.3 g/dL (ref 4.0–5.0)
Alkaline Phosphatase: 54 IU/L (ref 44–121)
BUN/Creatinine Ratio: 16 (ref 9–20)
BUN: 15 mg/dL (ref 6–24)
Bilirubin Total: 0.3 mg/dL (ref 0.0–1.2)
CO2: 20 mmol/L (ref 20–29)
Calcium: 8.9 mg/dL (ref 8.7–10.2)
Chloride: 103 mmol/L (ref 96–106)
Creatinine, Ser: 0.96 mg/dL (ref 0.76–1.27)
Globulin, Total: 2.5 g/dL (ref 1.5–4.5)
Glucose: 99 mg/dL (ref 65–99)
Potassium: 4.4 mmol/L (ref 3.5–5.2)
Sodium: 139 mmol/L (ref 134–144)
Total Protein: 6.8 g/dL (ref 6.0–8.5)
eGFR: 98 mL/min/{1.73_m2} (ref >59)

## 2020-09-16 LAB — TESTOSTERONE,FREE AND TOTAL
Testosterone, Free: 7.5 pg/mL (ref 6.8–21.5)
Testosterone: 190 ng/dL — ABNORMAL LOW (ref 264–916)

## 2020-09-16 LAB — LIPID PANEL
Chol/HDL Ratio: 4.7 ratio (ref 0.0–5.0)
Cholesterol, Total: 201 mg/dL — ABNORMAL HIGH (ref 100–199)
HDL: 43 mg/dL (ref >39)
LDL Chol Calc (NIH): 146 mg/dL — ABNORMAL HIGH (ref 0–99)
Triglycerides: 66 mg/dL (ref 0–149)
VLDL Cholesterol Cal: 12 mg/dL (ref 5–40)
